# Patient Record
Sex: Female | Born: 1965 | Race: White | Hispanic: No
Health system: Southern US, Community
[De-identification: ages and names within clinical notes are randomized; demographics above are authoritative.]

## PROBLEM LIST (undated history)

## (undated) DIAGNOSIS — F329 Major depressive disorder, single episode, unspecified: Secondary | ICD-10-CM

## (undated) DIAGNOSIS — F32A Depression, unspecified: Secondary | ICD-10-CM

## (undated) DIAGNOSIS — F419 Anxiety disorder, unspecified: Secondary | ICD-10-CM

## (undated) HISTORY — DX: Depression, unspecified: F32.A

## (undated) HISTORY — DX: Major depressive disorder, single episode, unspecified: F32.9

## (undated) HISTORY — DX: Anxiety disorder, unspecified: F41.9

## (undated) HISTORY — PX: MOUTH SURGERY: SHX715

---

## 1998-02-01 ENCOUNTER — Other Ambulatory Visit: Admission: RE | Admit: 1998-02-01 | Discharge: 1998-02-01 | Payer: Self-pay | Admitting: *Deleted

## 1998-12-28 ENCOUNTER — Other Ambulatory Visit: Admission: RE | Admit: 1998-12-28 | Discharge: 1998-12-28 | Payer: Self-pay | Admitting: *Deleted

## 2000-02-07 ENCOUNTER — Other Ambulatory Visit: Admission: RE | Admit: 2000-02-07 | Discharge: 2000-02-07 | Payer: Self-pay | Admitting: *Deleted

## 2001-03-05 ENCOUNTER — Other Ambulatory Visit: Admission: RE | Admit: 2001-03-05 | Discharge: 2001-03-05 | Payer: Self-pay | Admitting: *Deleted

## 2001-03-11 ENCOUNTER — Encounter: Payer: Self-pay | Admitting: Emergency Medicine

## 2001-03-11 ENCOUNTER — Emergency Department (HOSPITAL_COMMUNITY): Admission: EM | Admit: 2001-03-11 | Discharge: 2001-03-11 | Payer: Self-pay | Admitting: Emergency Medicine

## 2002-03-25 ENCOUNTER — Other Ambulatory Visit: Admission: RE | Admit: 2002-03-25 | Discharge: 2002-03-25 | Payer: Self-pay | Admitting: *Deleted

## 2002-09-26 ENCOUNTER — Other Ambulatory Visit: Admission: RE | Admit: 2002-09-26 | Discharge: 2002-09-26 | Payer: Self-pay | Admitting: Gynecology

## 2003-04-14 ENCOUNTER — Inpatient Hospital Stay (HOSPITAL_COMMUNITY): Admission: AD | Admit: 2003-04-14 | Discharge: 2003-04-17 | Payer: Self-pay | Admitting: Gynecology

## 2003-05-31 ENCOUNTER — Other Ambulatory Visit: Admission: RE | Admit: 2003-05-31 | Discharge: 2003-05-31 | Payer: Self-pay | Admitting: Gynecology

## 2004-07-23 ENCOUNTER — Other Ambulatory Visit: Admission: RE | Admit: 2004-07-23 | Discharge: 2004-07-23 | Payer: Self-pay | Admitting: Gynecology

## 2005-02-11 ENCOUNTER — Ambulatory Visit: Payer: Self-pay | Admitting: General Practice

## 2005-08-07 ENCOUNTER — Other Ambulatory Visit: Admission: RE | Admit: 2005-08-07 | Discharge: 2005-08-07 | Payer: Self-pay | Admitting: Gynecology

## 2006-02-25 ENCOUNTER — Ambulatory Visit: Payer: Self-pay | Admitting: General Practice

## 2006-08-10 ENCOUNTER — Other Ambulatory Visit: Admission: RE | Admit: 2006-08-10 | Discharge: 2006-08-10 | Payer: Self-pay | Admitting: Gynecology

## 2007-12-27 ENCOUNTER — Other Ambulatory Visit: Admission: RE | Admit: 2007-12-27 | Discharge: 2007-12-27 | Payer: Self-pay | Admitting: Gynecology

## 2008-03-08 ENCOUNTER — Ambulatory Visit: Payer: Self-pay | Admitting: General Practice

## 2009-01-04 ENCOUNTER — Ambulatory Visit: Payer: Self-pay | Admitting: Women's Health

## 2009-01-04 ENCOUNTER — Encounter: Payer: Self-pay | Admitting: Women's Health

## 2009-01-04 ENCOUNTER — Other Ambulatory Visit: Admission: RE | Admit: 2009-01-04 | Discharge: 2009-01-04 | Payer: Self-pay | Admitting: Gynecology

## 2009-01-23 ENCOUNTER — Encounter: Admission: RE | Admit: 2009-01-23 | Discharge: 2009-01-23 | Payer: Self-pay | Admitting: Neurosurgery

## 2009-03-13 ENCOUNTER — Ambulatory Visit: Payer: Self-pay | Admitting: General Practice

## 2010-01-07 ENCOUNTER — Other Ambulatory Visit: Admission: RE | Admit: 2010-01-07 | Discharge: 2010-01-07 | Payer: Self-pay | Admitting: Gynecology

## 2010-01-07 ENCOUNTER — Ambulatory Visit: Payer: Self-pay | Admitting: Women's Health

## 2010-02-26 ENCOUNTER — Ambulatory Visit: Payer: Self-pay | Admitting: General Practice

## 2010-03-05 ENCOUNTER — Ambulatory Visit: Payer: Self-pay | Admitting: General Practice

## 2010-06-03 ENCOUNTER — Encounter: Payer: Self-pay | Admitting: Neurosurgery

## 2010-09-27 NOTE — Discharge Summary (Signed)
Audrey Schneider, Audrey Schneider                        ACCOUNT NO.:  [id number]   MEDICAL RECORD NO.:  [id number]                   PATIENT TYPE:  INP   LOCATION:  9130                                 FACILITY:  [facility]   PHYSICIAN:  Timothy P. Fontaine, M.D.           DATE OF BIRTH:  09-Jul-1965   DATE OF ADMISSION:  04/14/2003  DATE OF DISCHARGE:  04/17/2003                                 DISCHARGE SUMMARY   DISCHARGE DIAGNOSES:  1. Intrauterine pregnancy at 40+ weeks delivered.  2. Advanced maternal age.  3. Arrest disorder of dilatation in labor.  4. Status post primary low transverse cesarean section by Dr. Sylvester Harder     on April 14, 2003.   HISTORY OF PRESENT ILLNESS:  This is a 45 year old female gravida 1, para 0  with an estimated date of confinement of April 10, 2003. Prenatal course  was complicated by advanced maternal age. The patient declined  amniocentesis.   HOSPITAL COURSE:  On April 14, 2003 the patient was admitted at 40+ weeks  spontaneous rupture of membranes. Pitocin augmentation was begun and  subsequently, diagnosis of arrest disorder of dilatation in labor was made  and therefore, the patient underwent a primary low transverse cesarean  section by Dr. Sylvester Harder on April 14, 2003. Underwent delivery of a  female with Apgar's of 9 and 9 and weight of 8 pounds and 11 ounces. There  were no complications. Postoperatively, the patient remained afebrile.  Voiding and in stable condition. She was discharged to home on April 17, 2003 and given _________  with postpartum booklet.   LABORATORY DATA:  The patient is A+, Rubella immune. On April 16, 2003  hemoglobin was 8.9.   DISPOSITION:  The patient is discharged to home.   FOLLOW UP:  To return in 4 to 6 weeks.  If any problems prior to that time,  will see me in the office.   DISCHARGE MEDICATIONS:  Tylox p.r.n. for pain.     Susa Loffler, P.A.                    Timothy P. Audie Box,  M.D.    TSG/MEDQ  D:  05/08/2003  T:  05/08/2003  Job:  [id number]

## 2010-09-27 NOTE — Op Note (Signed)
Audrey Schneider, BUCY NO.:  [id number]   MEDICAL RECORD NO.:  [id number]                   PATIENT TYPE:  INP   LOCATION:  9130                                 FACILITY:  [facility]   PHYSICIAN:  James A. Ashley Royalty, M.D.             DATE OF BIRTH:  1965/09/21   DATE OF PROCEDURE:  04/14/2003  DATE OF DISCHARGE:                                 OPERATIVE REPORT   PREOPERATIVE DIAGNOSES:  1. Intrauterine pregnancy at 40 weeks' gestation.  2. Arrest disorder of dilatation in labor.   POSTOPERATIVE DIAGNOSES:  1. Intrauterine pregnancy at 40 weeks' gestation.  2. Arrest disorder of dilatation in labor.   PROCEDURE:  Primary low transverse cesarean section.   SURGEON:  Rudy Jew. Ashley Royalty, M.D.   ANESTHESIA:  Epidural.   ESTIMATED BLOOD LOSS:  800 mL.   FINDINGS:  An 8 pound 11 ounce female, Apgars 9 at one minute and 9 at five  minutes, sent to the newborn nursery.   COMPLICATIONS:  None.   PACKS AND DRAINS:  Foley.   Sponge, needle, and instrument count reported as correct x2.   DESCRIPTION OF PROCEDURE:  The patient was taken to the operating room and  placed in the dorsal supine position with a functioning epidural in place.  She was prepped and draped in the usual manner for abdominal surgery.  A  Foley catheter had been previously placed.  After surgical levels of  epidural anesthetic were verified, a Pfannenstiel incision was made down to  the level of the fascia.  The fascia was nicked with a knife and incised  transversely with Mayo scissors.  The underlying rectus muscles were  separated from the fascia using sharp and blunt dissection.  The rectus  muscles were separated in the midline exposing the peritoneum, which was  elevated with hemostats and entered atraumatically with Metzenbaum scissors.  The incision was extended longitudinally.  The uterus was identified and a  bladder flap created by incising the anterior uterine serosa.  The  bladder  was held inferiorly with a bladder blade.  The uterus was then entered  through a low transverse incision using sharp and blunt dissection.  Fluid  was clear.  The infant was then delivered from a vertex presentation in an  atraumatic manner.  The infant was suctioned.  The cord was clamped, cut,  and the infant given immediately to the awaiting pediatrics team.  Arterial  cord pH was obtained from an isolated umbilical cord segment.  Next regular  umbilical cord blood was obtained.  The placenta and membranes were removed  in their entirety.  The uterus was exteriorized.  The uterus was then closed  in two running layers with #1 Vicryl.  The first was a running locking  layer.  The second was a running, intermittently locking and imbricating  layer.  One additional figure-of-eight suture was required to obtain  hemostasis.  Hemostasis was noted.  The uterus, tubes, and ovaries were inspected and found to be otherwise  completely normal.  They were returned to the abdominal cavity.  Copious  irrigation was accomplished.  Hemostasis was noted.  the rectus muscles were  reapproximated in the midline using 0 Vicryl in an interrupted fashion.  The  fascia was closed with 0 Vicryl in a running fashion.  The skin was closed  with 3-0 Monocryl in a subcuticular fashion.   The patient tolerated the procedure extremely well and was returned to the  recovery room in good condition.                                               James A. Ashley Royalty, M.D.    JAM/MEDQ  D:  04/14/2003  T:  04/15/2003  Job:  [id number]

## 2010-12-19 ENCOUNTER — Other Ambulatory Visit: Payer: Self-pay

## 2010-12-19 MED ORDER — NORETHIN ACE-ETH ESTRAD-FE 1-20 MG-MCG PO TABS: 1.0000 | ORAL_TABLET | Freq: Every day | ORAL | Status: DC

## 2010-12-19 NOTE — Telephone Encounter (Signed)
PT STATES MAIL ORDER PHARMACY ALREADY 2 DAYS LATE GETTING HER OCP'S TO HER. SHE REQUESTING SAMPLE OR CALL IN 1 PACK TO PAY OUT OF POCKET TIL MAIL ORDER ARRIVES. PER NANCY THE ONLY SAMPLES WE HAVE ARE THE LO-LO-ESTRIN 24 & THE HORMONE IS TOO LOW & PT COULD HAVE BLEEDING ON THEM. BEST TO CALL IN GENERIC TO LOCAL PHARMACY. PT STATES THE GENERIC SHE IS ON IS [facility]. I ADVISED PT HER [facility]IGHT NOT [facility] THAT EXACT GENERIC BUT THEY WOULD GIVE HER A SIMILAR ONE WITH THE SAME BASIC HORMONES. PT. OK WITH THIS SO I CALLED TO [facility] & HE STATES THE 1 THEY HAVE IN STOCK IS JUNEL 1/20-#28. TOLD HIM OK TO FILL WITH 1 ADDITIONAL REFILL.

## 2011-01-07 DIAGNOSIS — F419 Anxiety disorder, unspecified: Secondary | ICD-10-CM | POA: Insufficient documentation

## 2011-01-07 DIAGNOSIS — F329 Major depressive disorder, single episode, unspecified: Secondary | ICD-10-CM | POA: Insufficient documentation

## 2011-01-07 DIAGNOSIS — F32A Depression, unspecified: Secondary | ICD-10-CM | POA: Insufficient documentation

## 2011-01-10 ENCOUNTER — Other Ambulatory Visit: Payer: Self-pay | Admitting: Women's Health

## 2011-01-10 NOTE — Telephone Encounter (Signed)
REFILLED 1 TIME ONLY. AEX WITH NANCY 01-16-11.

## 2011-01-15 ENCOUNTER — Encounter: Payer: Self-pay | Admitting: Women's Health

## 2011-01-15 ENCOUNTER — Ambulatory Visit (INDEPENDENT_AMBULATORY_CARE_PROVIDER_SITE_OTHER): Payer: BC Managed Care – PPO | Admitting: Women's Health

## 2011-01-15 ENCOUNTER — Other Ambulatory Visit (HOSPITAL_COMMUNITY)
Admission: RE | Admit: 2011-01-15 | Discharge: 2011-01-15 | Disposition: A | Discharge status: Home / Self Care | Payer: BC Managed Care – PPO | Source: Ambulatory Visit | Attending: Women's Health | Admitting: Women's Health

## 2011-01-15 VITALS — BP 130/70 | Ht 65.0 in | Wt 211.0 lb

## 2011-01-15 DIAGNOSIS — Z01419 Encounter for gynecological examination (general) (routine) without abnormal findings: Secondary | ICD-10-CM

## 2011-01-15 NOTE — Progress Notes (Signed)
Chantalle Anderle [birth year] [id number]    History:    The patient presents for annual exam.  Works at [employer], daughter [name] is 7 and doing well.   Past medical history, past surgical history, family history and social history were all reviewed and documented in the EPIC chart.   ROS:  A  ROS was performed and pertinent positives and negatives are included in the history.  Exam:  Filed Vitals:   01/15/11 0917  BP: 130/70    General appearance:  Normal Head/Neck:  Normal, without cervical or supraclavicular adenopathy. Thyroid:  Symmetrical, normal in size, without palpable masses or nodularity. Respiratory  Effort:  Normal  Auscultation:  Clear without wheezing or rhonchi Cardiovascular  Auscultation:  Regular rate, without rubs, murmurs or gallops  Edema/varicosities:  Not grossly evident Abdominal  Soft,nontender, without masses, guarding or rebound.  Liver/spleen:  No organomegaly noted  Hernia:  None appreciated  Skin  Inspection:  Grossly normal  Palpation:  Grossly normal Neurologic/psychiatric  Orientation:  Normal with appropriate conversation.  Mood/affect:  Normal  Genitourinary    Breasts: Examined lying and sitting.     Right: Without masses, retractions, discharge or axillary adenopathy.     Left: Without masses, retractions, discharge or axillary adenopathy.   Inguinal/mons:  Normal without inguinal adenopathy  External genitalia:  Normal  BUS/Urethra/Skene's glands:  Normal  Bladder:  Normal  Vagina:  Normal  Cervix:  Normal  Uterus:  normal in size, shape and contour.  Midline and mobile  Adnexa/parametria:     Rt: Without masses or tenderness.   Lt: Without masses or tenderness.  Anus and perineum: Normal  Digital rectal exam: Normal sphincter tone without palpated masses or tenderness  Assessment/Plan:  45 y.o.SWF G1P1  for annual exam. Monthly 2-3 day cycle on Loestrin 1/20 without complaint. She is currently on Celexa 20 mg daily and  states is doing much better with that and would like to continue. She has had counseling in the past denies need for any at this time. She does use an occasional Valium 5 mg for anxiety, is aware it is addictive and to use sparingly.  Normal GYN exam  Plan: SBEs, yearly mammogram which have been normal, encouraged a calcium rich diet, discouraged tanning. She has had her labs through work which have all been normal. Pap only today encouraged to continue exercise decrease calories for weight loss. Prescription, proper use, slight risk for blood clots and strokes were reviewed for the Loestrin, a prescription was given. Condoms encouraged if she becomes sexually active. Prescriptions for Celexa 20  1 by mouth daily, and Valium 5 mg when necessary were given.   Harrington Challenger [facility], 1:26 PM 01/15/2011

## 2011-02-08 ENCOUNTER — Other Ambulatory Visit: Payer: Self-pay | Admitting: Women's Health

## 2011-03-04 ENCOUNTER — Ambulatory Visit: Payer: Self-pay

## 2011-04-01 ENCOUNTER — Other Ambulatory Visit: Payer: Self-pay

## 2011-04-01 MED ORDER — DIAZEPAM 5 MG PO TABS: 5.0000 mg | ORAL_TABLET | Freq: Four times a day (QID) | ORAL | Status: AC | PRN

## 2011-04-01 NOTE — Telephone Encounter (Signed)
RX CALLED TO THE PHARMACY

## 2011-04-10 ENCOUNTER — Other Ambulatory Visit: Payer: Self-pay | Admitting: Women's Health

## 2011-07-21 IMAGING — MG MM DIGITAL SCREENING BILAT W/ CAD
1 series · 5 of 5 positions shown · non-contrast
Comparison: none

REASON FOR EXAM: [facility]
COMMENTS:

PROCEDURE:     MAM - MAM [facility]P SCRN MAMMO DIGITAL /CAD  - March 05, 2010  [DATE]
RESULT:      No dominant masses or pathologic clustered calcifications are
demonstrated.  A nodular parenchymal pattern is present. CAD evaluation is
nonfocal.

[Series 5742: R CC · right · 5 of 5 slices shown]
[im 1/5]
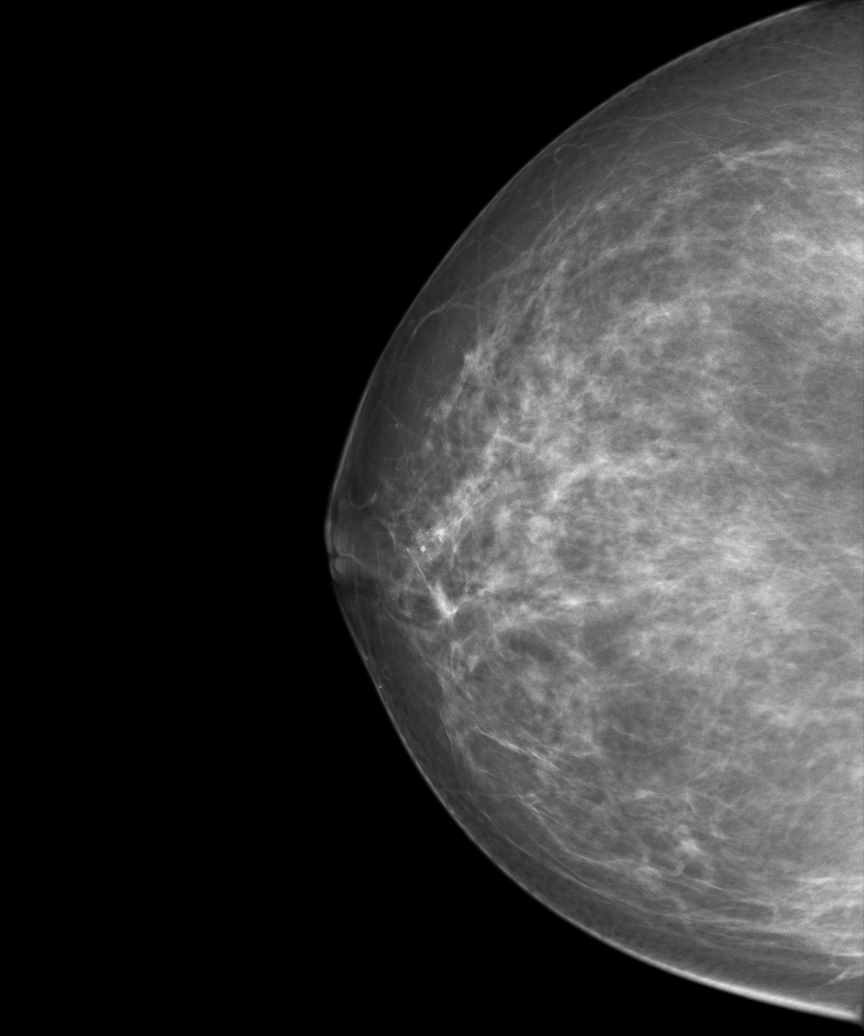
[im 2/5]
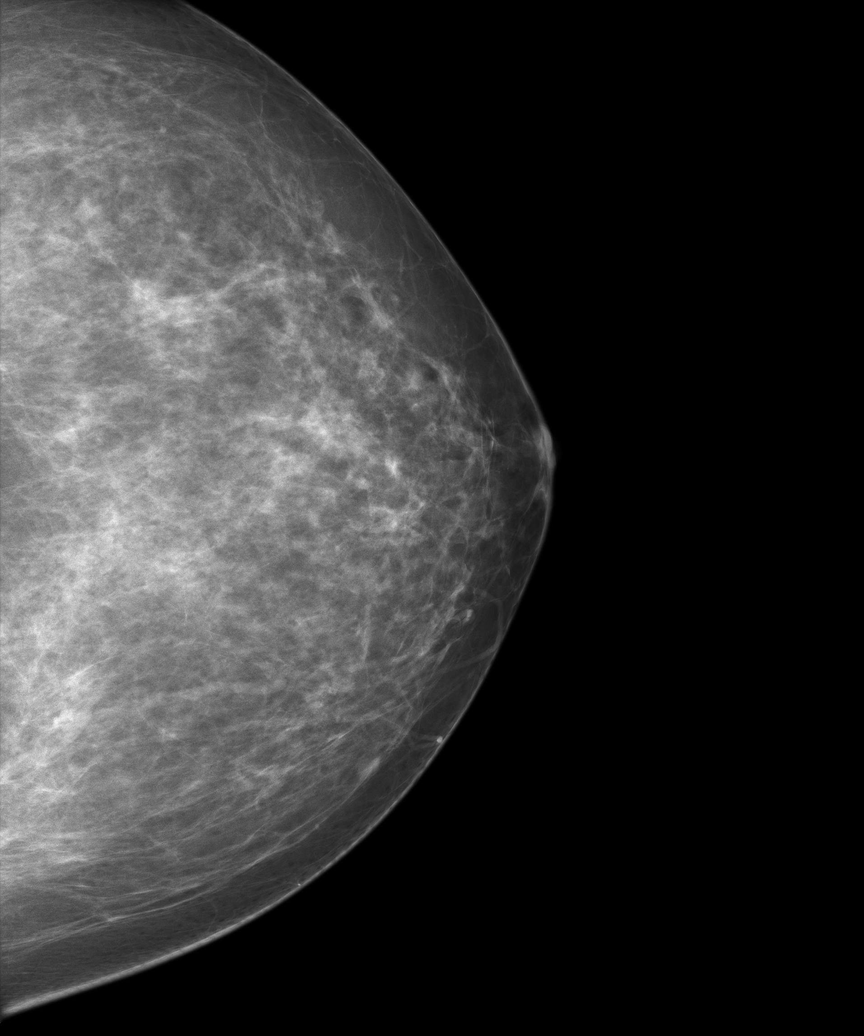
[im 3/5]
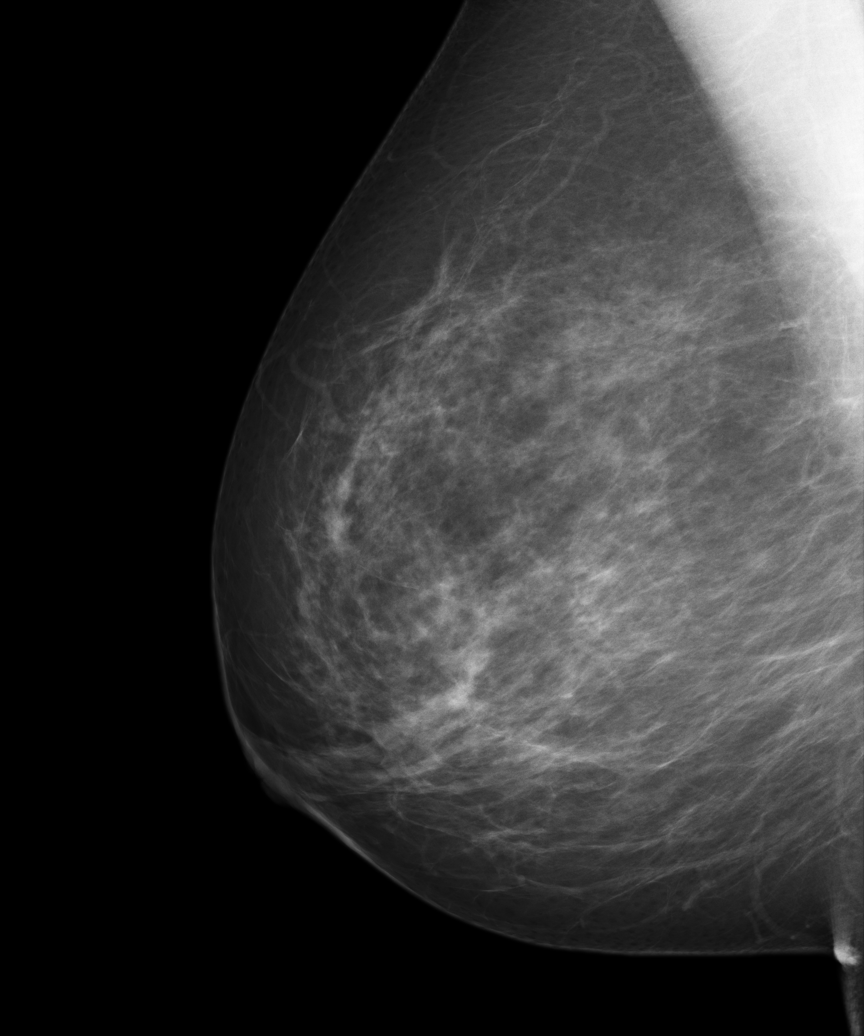
[im 4/5]
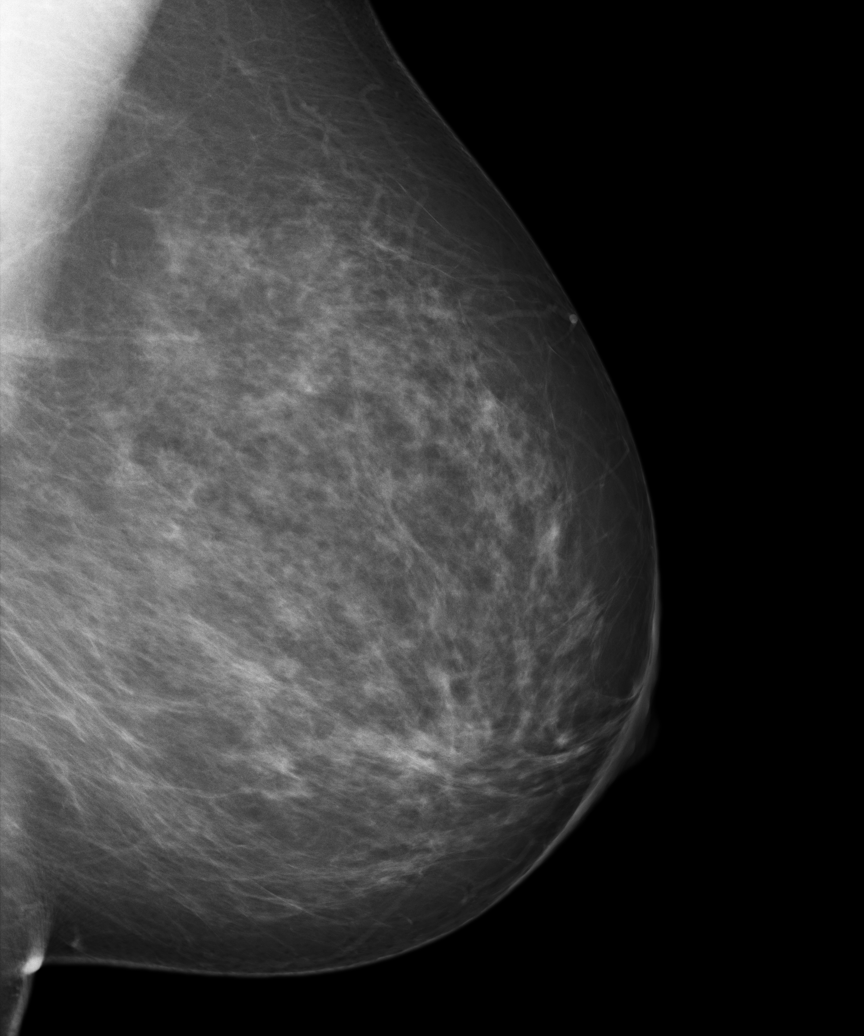
[im 5/5]
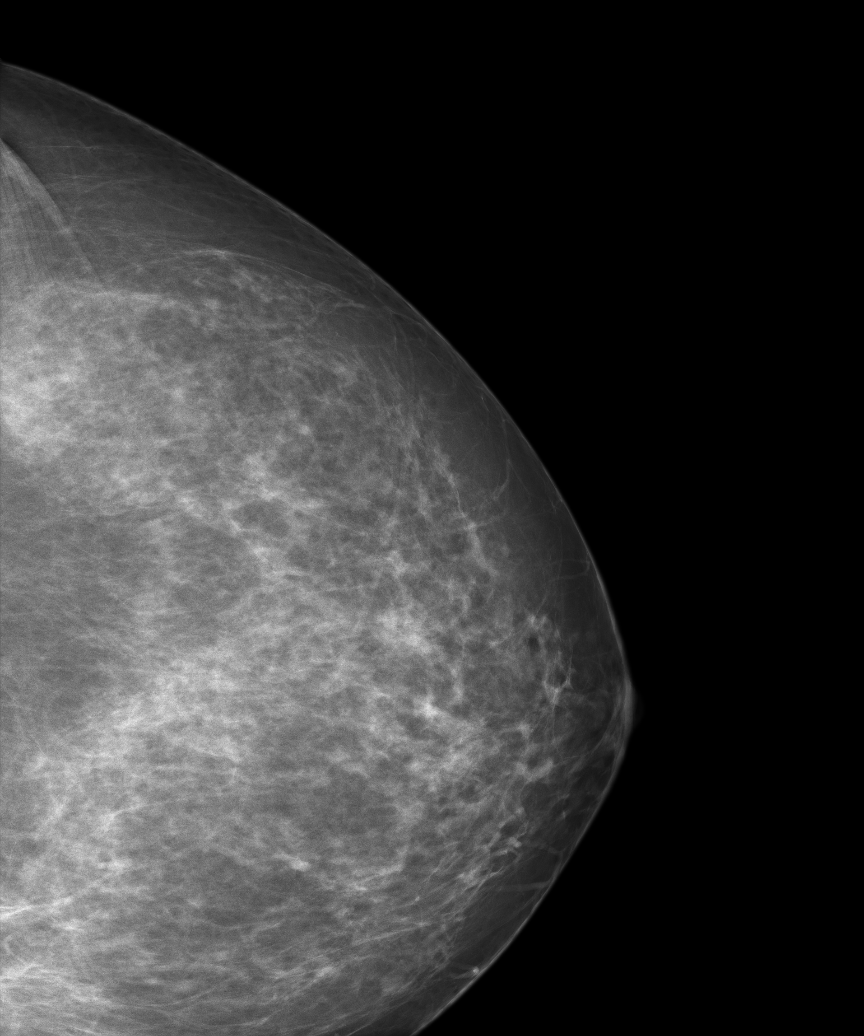

[5 of 5 positions shown; findings below may reference images not displayed]

IMPRESSION: 1.     Benign exam.
2.     Routine yearly follow up exam is suggested.
3.     BI-RADS:  Category 2- Benign Finding.

A negative mammogram report does not preclude biopsy or other evaluation of
a clinically palpable or otherwise suspicious mass or lesion. Breast cancer
may not be detected by mammography in up to 10% of cases.

## 2011-09-19 ENCOUNTER — Telehealth: Payer: Self-pay | Admitting: *Deleted

## 2011-09-19 MED ORDER — NORETHIN ACE-ETH ESTRAD-FE 1-20 MG-MCG PO TABS: 1.0000 | ORAL_TABLET | Freq: Every day | ORAL | Status: DC

## 2011-09-19 NOTE — Telephone Encounter (Signed)
Pt called requesting 1 pack called into local pharmacy, mail order messed rx up, rx sent.

## 2012-01-23 ENCOUNTER — Telehealth: Payer: Self-pay | Admitting: *Deleted

## 2012-01-23 MED ORDER — NORETHIN ACE-ETH ESTRAD-FE 1-20 MG-MCG PO TABS: 1.0000 | ORAL_TABLET | Freq: Every day | ORAL | Status: DC

## 2012-01-23 NOTE — Telephone Encounter (Signed)
Pt has annual scheduled on 01/28/12, she will need 1 pack sent to pharmacy. 1 pack of birth control sent.

## 2012-01-28 ENCOUNTER — Encounter: Payer: Self-pay | Admitting: Women's Health

## 2012-01-28 ENCOUNTER — Ambulatory Visit (INDEPENDENT_AMBULATORY_CARE_PROVIDER_SITE_OTHER): Payer: BC Managed Care – PPO | Admitting: Women's Health

## 2012-01-28 VITALS — BP 138/90 | Ht 65.0 in | Wt 213.0 lb

## 2012-01-28 DIAGNOSIS — Z01419 Encounter for gynecological examination (general) (routine) without abnormal findings: Secondary | ICD-10-CM

## 2012-01-28 DIAGNOSIS — F329 Major depressive disorder, single episode, unspecified: Secondary | ICD-10-CM

## 2012-01-28 DIAGNOSIS — F341 Dysthymic disorder: Secondary | ICD-10-CM

## 2012-01-28 DIAGNOSIS — F32A Depression, unspecified: Secondary | ICD-10-CM

## 2012-01-28 DIAGNOSIS — F419 Anxiety disorder, unspecified: Secondary | ICD-10-CM

## 2012-01-28 DIAGNOSIS — IMO0001 Reserved for inherently not codable concepts without codable children: Secondary | ICD-10-CM

## 2012-01-28 DIAGNOSIS — Z309 Encounter for contraceptive management, unspecified: Secondary | ICD-10-CM

## 2012-01-28 MED ORDER — CITALOPRAM HYDROBROMIDE 40 MG PO TABS: ORAL_TABLET | ORAL | Status: DC

## 2012-01-28 MED ORDER — NORETHINDRONE 0.35 MG PO TABS: 1.0000 | ORAL_TABLET | Freq: Every day | ORAL | Status: DC

## 2012-01-28 NOTE — Patient Instructions (Signed)

## 2012-01-28 NOTE — Progress Notes (Signed)
Audrey Schneider 1966-03-10 [id number]    History:    The patient presents for annual exam.  Amenorrheic on Loestrin 1/20 for history of menorrhagia with dysmenorrhea. Rare intercourse with same partner. History of anxiety and depression currently on Celexa 20 mg requesting to increase dosage. Seeing a therapist. History of normal Paps and mammograms. Blood pressure elevated at 138/90 today no history of blood pressure elevations.  Past medical history, past surgical history, family history and social history were all reviewed and documented in the EPIC chart. Works at [employer] in [employer]. Daughter [name] 8 having some problems with ADD.   ROS:  A  ROS was performed and pertinent positives and negatives are included in the history.  Exam:  Filed Vitals:   01/28/12 0835  BP: 138/90    General appearance:  Normal Head/Neck:  Normal, without cervical or supraclavicular adenopathy. Thyroid:  Symmetrical, normal in size, without palpable masses or nodularity. Respiratory  Effort:  Normal  Auscultation:  Clear without wheezing or rhonchi Cardiovascular  Auscultation:  Regular rate, without rubs, murmurs or gallops  Edema/varicosities:  Not grossly evident Abdominal  Soft,nontender, without masses, guarding or rebound.  Liver/spleen:  No organomegaly noted  Hernia:  None appreciated  Skin  Inspection:  Grossly normal  Palpation:  Grossly normal Neurologic/psychiatric  Orientation:  Normal with appropriate conversation.  Mood/affect:  Normal  Genitourinary    Breasts: Examined lying and sitting.     Right: Without masses, retractions, discharge or axillary adenopathy.     Left: Without masses, retractions, discharge or axillary adenopathy.   Inguinal/mons:  Normal without inguinal adenopathy  External genitalia:  Normal  BUS/Urethra/Skene's glands:  Normal  Bladder:  Normal  Vagina:  Normal  Cervix:  Normal  Uterus:   normal in size, shape and contour.  Midline and  mobile  Adnexa/parametria:     Rt: Without masses or tenderness.   Lt: Without masses or tenderness.  Anus and perineum: Normal  Digital rectal exam: Normal sphincter tone without palpated masses or tenderness  Assessment/Plan:  46 y.o. S. WF G1 P1 for annual exam.  Anxiety/depression on Celexa Amenorrheic on Loestrin 1/20 continuously  Plan: Celexa 40 mg daily prescription, proper use, continue counseling instructed to call if no relief of symptoms. Reviewed importance of exercise in relationship to anxiety/depression and general health. SBE's, continue annual mammogram, calcium rich diet, vitamin D 1000 daily, decrease calories for weight loss encouraged. Blood pressure slightly elevated, amenorrheic on Loestrin 1/20 will try Micronor daily, prescription, proper use, given and reviewed. Instructed to check blood pressure, if continues greater than 130/80 seek treatment with primary care. Reviewed increased risks of blood clots and strokes with combination birth control pills and elevated blood pressure. No labs, reports normal labs at work instructed to fax results to office. No Pap history of normal Paps, new screening guidelines reviewed.    Harrington Challenger [facility], 12:58 PM 01/28/2012

## 2012-01-31 ENCOUNTER — Other Ambulatory Visit: Payer: Self-pay | Admitting: Women's Health

## 2012-03-03 ENCOUNTER — Ambulatory Visit: Payer: Self-pay | Admitting: General Practice

## 2012-03-11 ENCOUNTER — Other Ambulatory Visit: Payer: Self-pay | Admitting: Women's Health

## 2012-04-26 ENCOUNTER — Other Ambulatory Visit: Payer: Self-pay | Admitting: *Deleted

## 2012-04-26 MED ORDER — DIAZEPAM 5 MG PO TABS: 5.0000 mg | ORAL_TABLET | Freq: Four times a day (QID) | ORAL | Status: DC | PRN

## 2012-04-26 NOTE — Telephone Encounter (Signed)
Called RX to pharmacy [facility]

## 2012-04-27 NOTE — Telephone Encounter (Signed)
Called to [facility] in [facility] not to [facility]

## 2012-05-24 ENCOUNTER — Telehealth: Payer: Self-pay | Admitting: *Deleted

## 2012-05-24 NOTE — Telephone Encounter (Signed)
Pt left message in voicemail requesting to switch birth to old rx, c/o spotting with Micronor.

## 2012-05-25 NOTE — Telephone Encounter (Signed)
Pt called back and took her blood pressure today and it was 110/74.

## 2012-05-25 NOTE — Telephone Encounter (Signed)
(  pt aware you are out of the office) Pt called requesting if she could switch back to old rx Loestrin 1/20, she is having some spotting within the pill back. Taking on time daily. I asked her about her blood pressure and she told me she has not been taking it. I told her I would relay message to you. Please advise

## 2012-05-26 ENCOUNTER — Other Ambulatory Visit: Payer: Self-pay | Admitting: Women's Health

## 2012-05-26 DIAGNOSIS — N946 Dysmenorrhea, unspecified: Secondary | ICD-10-CM

## 2012-05-26 MED ORDER — NORETHIN ACE-ETH ESTRAD-FE 1-20 MG-MCG PO TABS: 1.0000 | ORAL_TABLET | Freq: Every day | ORAL | Status: DC

## 2012-05-26 NOTE — Telephone Encounter (Signed)
Message left

## 2012-05-26 NOTE — Telephone Encounter (Signed)
Report states felt better on Loestrin 1/20 is having occasional spotting on the Micronor but states has increased cramps monthly. Blood pressure 110/70 will have nurse at work check blood pressure monthly and if greater than 130/80 will stop Loestrin and start back on Micronor. Rare intercourse/same partner.

## 2012-06-10 ENCOUNTER — Telehealth: Payer: Self-pay | Admitting: *Deleted

## 2012-06-10 DIAGNOSIS — N946 Dysmenorrhea, unspecified: Secondary | ICD-10-CM

## 2012-06-10 MED ORDER — NORETHIN ACE-ETH ESTRAD-FE 1-20 MG-MCG PO TABS: 1.0000 | ORAL_TABLET | Freq: Every day | ORAL | Status: DC

## 2012-06-10 NOTE — Telephone Encounter (Signed)
Pt called [facility] [place]. Rx sent.

## 2013-02-12 ENCOUNTER — Other Ambulatory Visit: Payer: Self-pay | Admitting: Women's Health

## 2013-02-17 ENCOUNTER — Ambulatory Visit (INDEPENDENT_AMBULATORY_CARE_PROVIDER_SITE_OTHER): Payer: PRIVATE HEALTH INSURANCE | Admitting: Women's Health

## 2013-02-17 ENCOUNTER — Encounter: Payer: Self-pay | Admitting: Women's Health

## 2013-02-17 ENCOUNTER — Other Ambulatory Visit (HOSPITAL_COMMUNITY)
Admission: RE | Admit: 2013-02-17 | Discharge: 2013-02-17 | Disposition: A | Discharge status: Home / Self Care | Payer: BC Managed Care – PPO | Source: Ambulatory Visit | Attending: Gynecology | Admitting: Gynecology

## 2013-02-17 VITALS — BP 126/84 | Ht 65.0 in | Wt 204.6 lb

## 2013-02-17 DIAGNOSIS — G8929 Other chronic pain: Secondary | ICD-10-CM | POA: Insufficient documentation

## 2013-02-17 DIAGNOSIS — M549 Dorsalgia, unspecified: Secondary | ICD-10-CM

## 2013-02-17 DIAGNOSIS — F411 Generalized anxiety disorder: Secondary | ICD-10-CM

## 2013-02-17 DIAGNOSIS — Z01419 Encounter for gynecological examination (general) (routine) without abnormal findings: Secondary | ICD-10-CM

## 2013-02-17 DIAGNOSIS — N946 Dysmenorrhea, unspecified: Secondary | ICD-10-CM

## 2013-02-17 DIAGNOSIS — G894 Chronic pain syndrome: Secondary | ICD-10-CM

## 2013-02-17 MED ORDER — DIAZEPAM 5 MG PO TABS: 5.0000 mg | ORAL_TABLET | Freq: Four times a day (QID) | ORAL | Status: DC | PRN

## 2013-02-17 MED ORDER — CITALOPRAM HYDROBROMIDE 40 MG PO TABS: ORAL_TABLET | ORAL | Status: DC

## 2013-02-17 MED ORDER — HYDROCODONE-ACETAMINOPHEN 5-325 MG PO TABS: 1.0000 | ORAL_TABLET | Freq: Four times a day (QID) | ORAL | Status: DC | PRN

## 2013-02-17 MED ORDER — NORETHIN ACE-ETH ESTRAD-FE 1-20 MG-MCG PO TABS: 1.0000 | ORAL_TABLET | Freq: Every day | ORAL | Status: DC

## 2013-02-17 NOTE — Patient Instructions (Signed)

## 2013-02-17 NOTE — Addendum Note (Signed)
Addended by: Richardson Chiquito on: 02/17/2013 04:24 PM   Modules accepted: Orders

## 2013-02-17 NOTE — Progress Notes (Signed)
Audrey Schneider [birth year] [id number]    History:    The patient presents for annual exam.  Complains of low back pain since Saturday morning, preventing full ROM/ mobility.  Had a deep tissue massage with some relief.  Chronic back pain managed by orthopedist, last Rx several months ago, has not picked up a new Rx due to changes in law  Has appointment to go to pain clinic in December.  Denies urinary symptoms, constipation, or lower abdominal/ pelvic pain.  Sexually active/ same partner/ takes Loestrin 1/20 continuously.  Anxiety/ depression managed with Celexa, rare Valium and counseling .  No sleeping issues.  10 lb weight loss within year from healthy dieting.  History of normal paps and mammograms.  Recent lab work normal.     Past medical history, past surgical history, family history and social history were all reviewed and documented in the EPIC chart. Works at [employer] x 17 years  Healthy 11 yo daughter Clement Sayres, ADD managed well  Mother - Diabetes, father died age 45  ROS:  A  ROS was performed and pertinent positives and negatives are included in the history.  Exam:  Filed Vitals:   02/17/13 0817  BP: 126/84    General appearance:  Normal Head/Neck:  Normal, without cervical or supraclavicular adenopathy. Thyroid:  Symmetrical, normal in size, without palpable masses or nodularity. Respiratory  Effort:  Normal  Auscultation:  Clear without wheezing or rhonchi Cardiovascular  Auscultation:  Regular rate, without rubs, murmurs or gallops  Edema/varicosities:  Not grossly evident Abdominal  Soft,nontender, without masses, guarding or rebound.  Liver/spleen:  No organomegaly noted  Hernia:  None appreciated  Skin  Inspection:  Grossly normal  Palpation:  Grossly normal Neurologic/psychiatric  Orientation:  Normal with appropriate conversation.  Mood/affect:  Normal  Genitourinary    Breasts: Examined lying and sitting.     Right: Without masses,  retractions, discharge or axillary adenopathy.     Left: Without masses, retractions, discharge or axillary adenopathy.   Inguinal/mons:  Normal without inguinal adenopathy  External genitalia:  Normal  BUS/Urethra/Skene's glands:  Normal  Bladder:  Normal  Vagina:  Normal  Cervix:  Normal  Uterus:  Normal in size, shape and contour.  Midline and mobile  Adnexa/parametria:     Rt: Without masses or tenderness.   Lt: Without masses or tenderness.  Anus and perineum: Normal  Digital rectal exam: Normal sphincter tone without palpated masses or tenderness  Assessment/Plan:  47 y.o.  SWF G1P1 for annual exam.    Normal Gynecological Exam Chronic Back Pain - managed with Vicodin from orthopedist Anxiety/Depression - Celexa Obesity - 10 lb weight loss within year  Plan: Loestrin 1/20 prescription, proper use, slight risk for blood clots and strokes. Takes continuously/amenorrheic. Vicodin 5/325 prescription #30 with no refills given, aware of addictive properties, uses sparingly, has appointment with pain clinic in December which will keep. The Celexa 40 mg by mouth daily prescription, proper use given and reviewed continue counseling as needed. Valium 5 mg po q 6 hrs prn, aware addictive properties uses one prescription every 6 months.    Continue SBEs, annual mammograms, weight loss, healthy diet/ exercise/ yoga, anxiety management.  Pap.  Pap normal 2012, new screening guidelines reviewed. Instructed to fax lab results and mammogram that are done through work.   Harrington Challenger [facility], 8:53 AM 02/17/2013

## 2013-03-10 ENCOUNTER — Ambulatory Visit: Payer: Self-pay | Admitting: General Practice

## 2013-03-22 ENCOUNTER — Telehealth: Payer: Self-pay | Admitting: Women's Health

## 2013-03-22 NOTE — Telephone Encounter (Signed)
To review labs and mammogram. Mammogram normal but noted to be dense encouraged and reviewed 3-D tomography with next mammogram. CBC, comprehensive metabolic, thyroid panel all normal. Lipid panel slightly elevated reviewed importance of decreasing saturated fat to less than 20 g per day, fish oil supplement daily and increase regular exercise. Reviewed hemoglobin A1c elevated at 6 which puts her at more risk for diabetes. Reviewed importance of reducing simple sugars in diet. Random blood sugar 99.

## 2013-07-19 IMAGING — MG MM DIGITAL SCREENING BILAT W/ CAD
1 series · 4 of 4 positions shown · non-contrast
Comparison: none

REASON FOR EXAM: [facility] no order Biedronki [place]ocha [id number]
COMMENTS:

PROCEDURE:     MAM - MAM DGT SCR W/CAD CORP NO ORDER  - March 03, 2012  [DATE]
RESULT:     Breast are moderately dense and nodular. CAD evaluation is
nonfocal. Exam stable from prior exams.

[R CC · right · 4 of 4 slices shown]
[im 1/4]
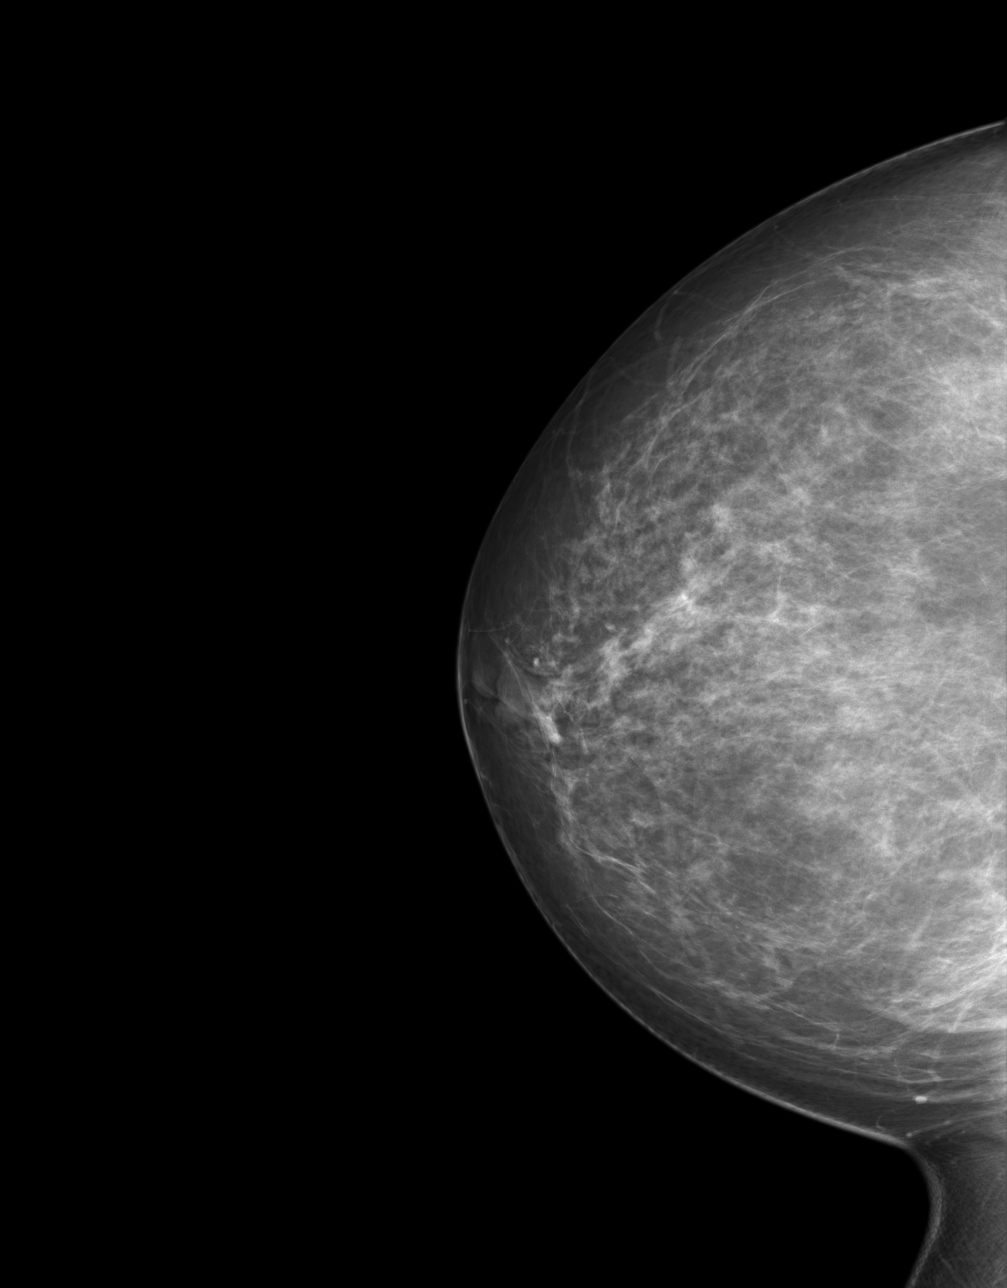
[im 2/4]
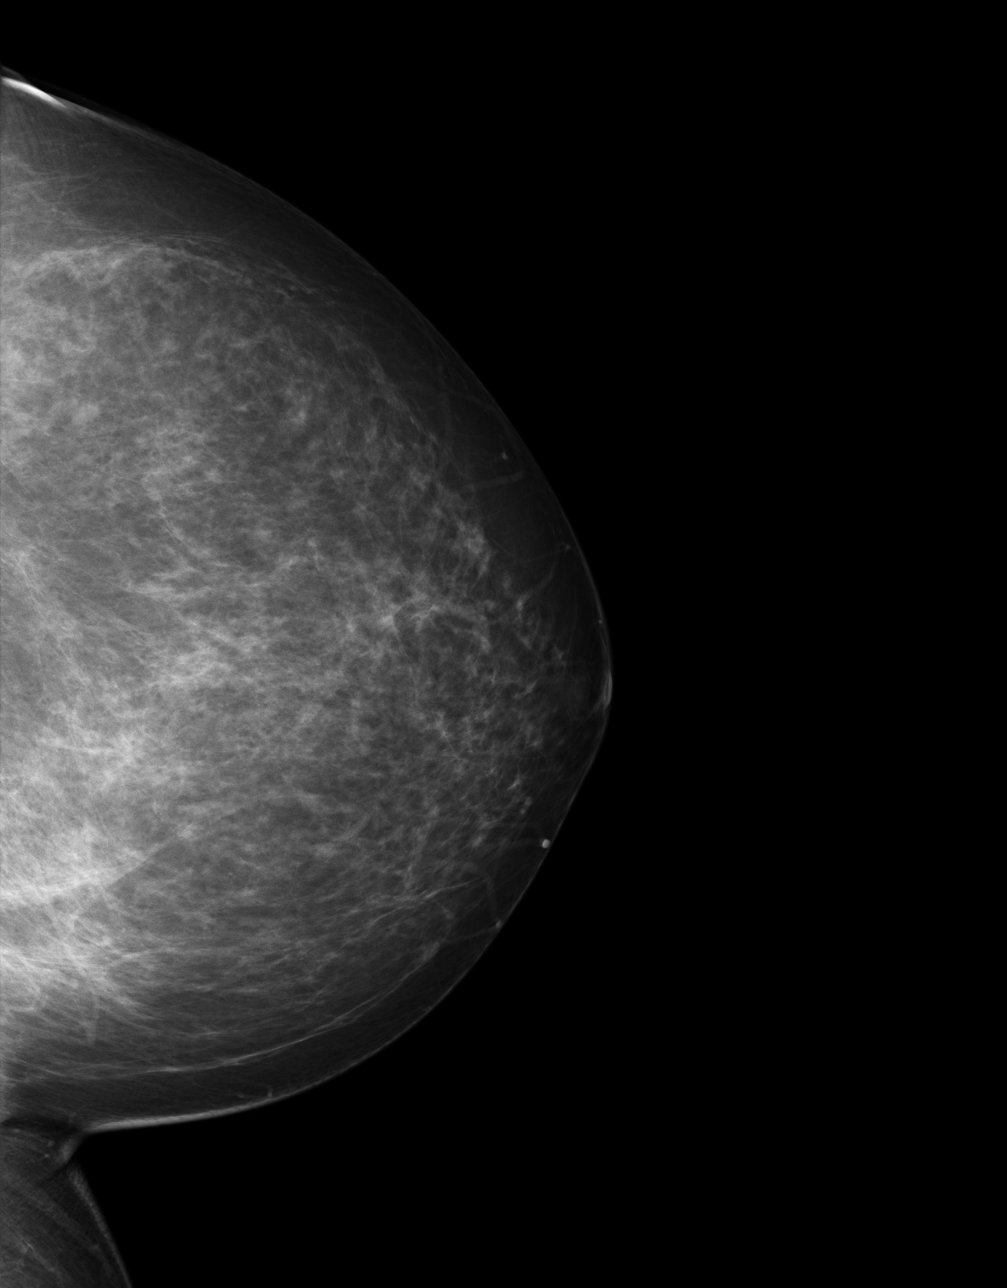
[im 3/4]
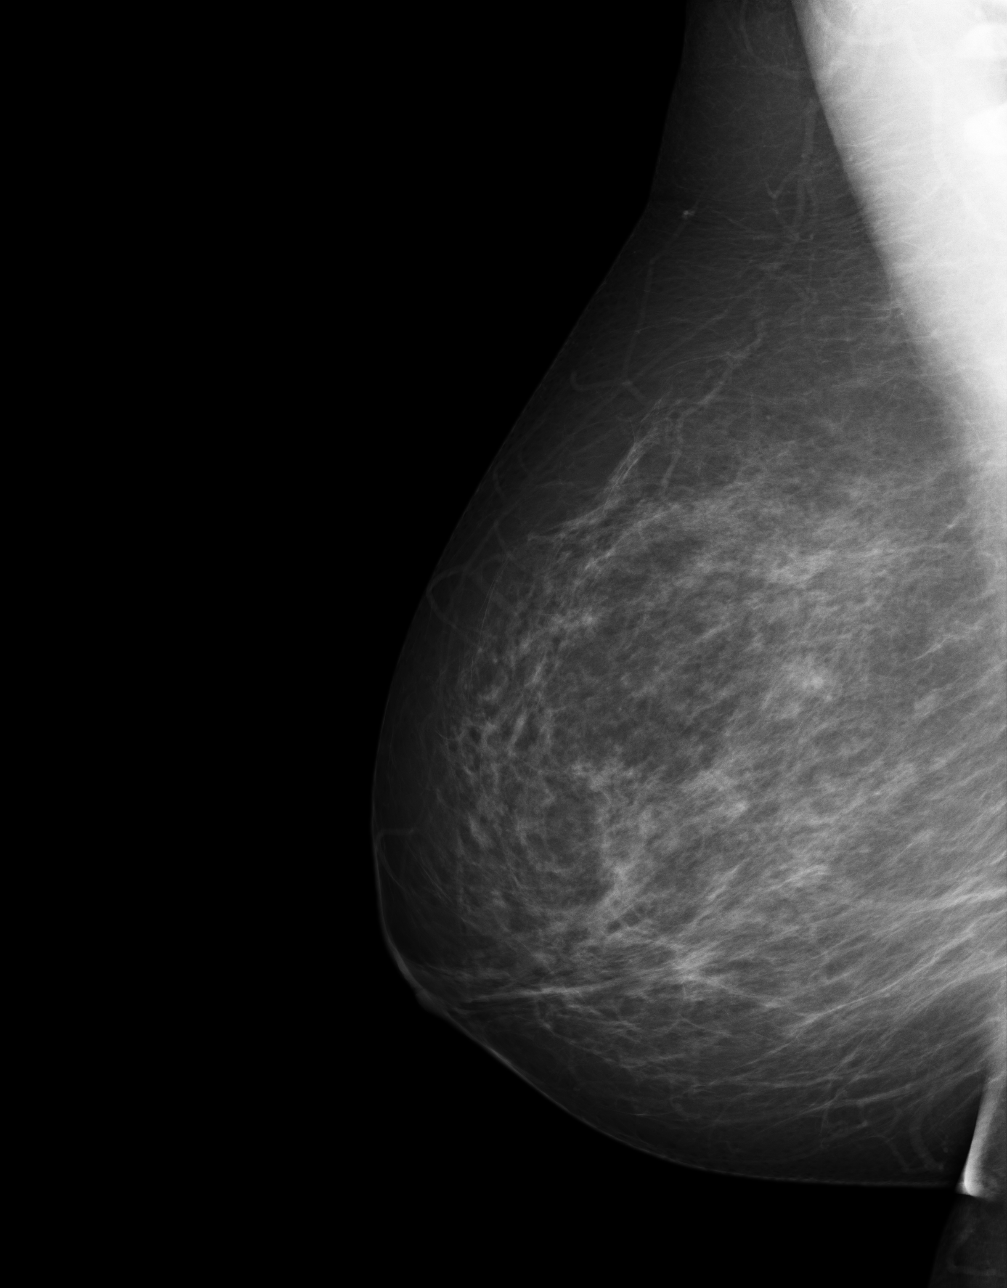
[im 4/4]
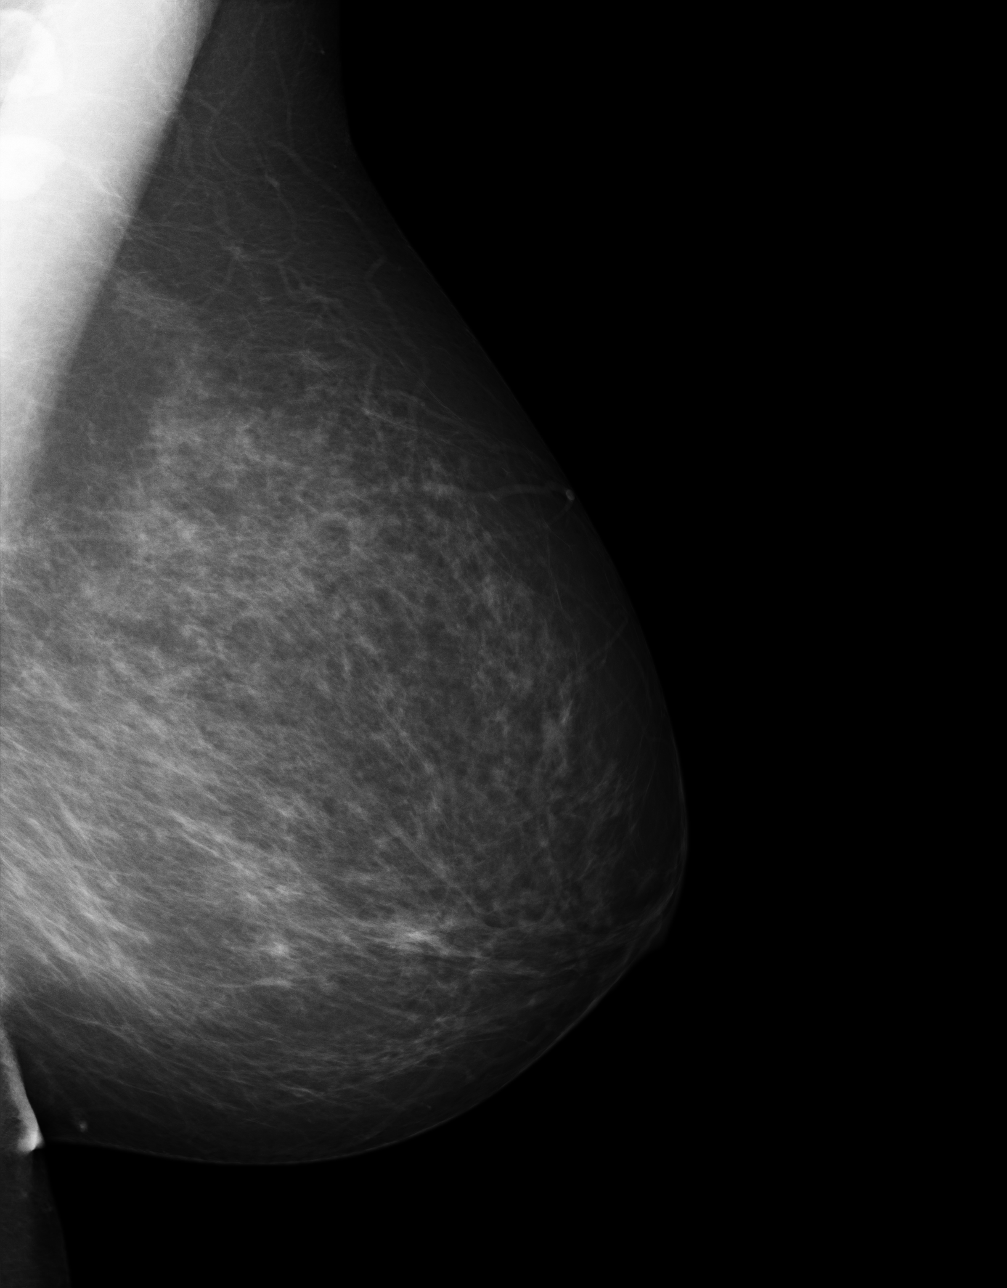

[4 of 4 positions shown; findings below may reference images not displayed]

IMPRESSION: Stable benign exam.

BI-RADS: Category 2- Benign Finding

A NEGATIVE MAMMOGRAM REPORT DOES NOT PRECLUDE BIOPSY OR OTHER EVALUATION OF
A CLINICALLY PALPABLE OR OTHERWISE SUSPICIOUS MASS OR LESION. BREAST CANCER
MAY NOT BE DETECTED IN UP TO 10% OF CASES.

## 2014-02-22 ENCOUNTER — Encounter: Payer: Self-pay | Admitting: Women's Health

## 2014-02-22 ENCOUNTER — Ambulatory Visit (INDEPENDENT_AMBULATORY_CARE_PROVIDER_SITE_OTHER): Payer: No Typology Code available for payment source | Admitting: Women's Health

## 2014-02-22 VITALS — BP 120/70 | Ht 65.0 in | Wt 223.0 lb

## 2014-02-22 DIAGNOSIS — F329 Major depressive disorder, single episode, unspecified: Secondary | ICD-10-CM

## 2014-02-22 DIAGNOSIS — Z01419 Encounter for gynecological examination (general) (routine) without abnormal findings: Secondary | ICD-10-CM

## 2014-02-22 DIAGNOSIS — F32A Depression, unspecified: Secondary | ICD-10-CM

## 2014-02-22 DIAGNOSIS — N946 Dysmenorrhea, unspecified: Secondary | ICD-10-CM

## 2014-02-22 LAB — URINALYSIS W MICROSCOPIC + REFLEX CULTURE
BILIRUBIN URINE: NEGATIVE
CASTS: NONE SEEN
Crystals: NONE SEEN
Glucose, UA: NEGATIVE mg/dL
KETONES UR: NEGATIVE mg/dL
Leukocytes, UA: NEGATIVE
Nitrite: NEGATIVE
PH: 7 (ref 5.0–8.0)
Protein, ur: NEGATIVE mg/dL
SPECIFIC GRAVITY, URINE: 1.021 (ref 1.005–1.030)
SQUAMOUS EPITHELIAL / LPF: NONE SEEN
Urobilinogen, UA: 0.2 mg/dL (ref 0.0–1.0)

## 2014-02-22 MED ORDER — NORETHIN ACE-ETH ESTRAD-FE 1-20 MG-MCG PO TABS: 1.0000 | ORAL_TABLET | Freq: Every day | ORAL | Status: DC

## 2014-02-22 MED ORDER — CITALOPRAM HYDROBROMIDE 40 MG PO TABS: ORAL_TABLET | ORAL | Status: DC

## 2014-02-22 NOTE — Progress Notes (Signed)
Audrey Schneider M Audrey Schneider 03-May-1966 [id number]    History:    Presents for annual exam.  Amenorrheic on Loestrin 1/20 continuously. Has had problems with chronic back pain is currently on no medication. Struggling with ADD symptoms, has taken daughters Concerta, which has helped and would like a prescription to try for one month. Labs at work. Normal Pap and mammogram history. Anxiety/depression stable on Celexa.  Past medical history, past surgical history, family history and social history were all reviewed and documented in the EPIC chart. Works at [employer] show [employer]. Audrey Schneider, 9 doing well. Mother diabetes died age 162, father died age 32100.  ROS:  A  12 point ROS was performed and pertinent positives and negatives are included.  Exam:  Filed Vitals:   02/22/14 0756  BP: 120/70    General appearance:  Normal Thyroid:  Symmetrical, normal in size, without palpable masses or nodularity. Respiratory  Auscultation:  Clear without wheezing or rhonchi Cardiovascular  Auscultation:  Regular rate, without rubs, murmurs or gallops  Edema/varicosities:  Not grossly evident Abdominal  Soft,nontender, without masses, guarding or rebound.  Liver/spleen:  No organomegaly noted  Hernia:  None appreciated  Skin  Inspection:  Grossly normal   Breasts: Examined lying and sitting.     Right: Without masses, retractions, discharge or axillary adenopathy.     Left: Without masses, retractions, discharge or axillary adenopathy. Gentitourinary   Inguinal/mons:  Normal without inguinal adenopathy  External genitalia:  Normal  BUS/Urethra/Skene's glands:  Normal  Vagina:  Normal  Cervix:  Normal  Uterus:  normal in size, shape and contour.  Midline and mobile  Adnexa/parametria:     Rt: Without masses or tenderness.   Lt: Without masses or tenderness.  Anus and perineum: Normal  Digital rectal exam: Normal sphincter tone without palpated masses or tenderness  Assessment/Plan:  48 y.o. SWF G1P1 for  annual exam.     Amenorrheic on Loestrin continuously Anxiety/depression stable on Celexa Obesity Probable ADD  Plan: Concerta 36 mg 2 tablets daily prescription, proper use given and reviewed need to seek care with psychologist for ADD testing and to continue prescribing if needed (one month supply only).  Celexa 40 mg by mouth daily prescription, proper use given and reviewed. Reviewed importance of leisure activities. Loestrin 1/20 prescription, proper use given and reviewed slight risk for blood clots and strokes. Instructed to schedule annual exam during placebo week [facility]. SBE's, continue annual mammogram, 3-D tomography reviewed and encouraged history of dense breast. Increase regular exercise and decrease calories, Weight Watchers encouraged for weight loss. Pap normal 2014, new screening guidelines reviewed. Instructed to fax lab results from work health screening.   Harrington ChallengerYOUNG,Audrey Schneider [facility]ownWHNP, 8:42 AM 02/22/2014

## 2014-02-22 NOTE — Patient Instructions (Signed)

## 2014-02-25 LAB — URINE CULTURE: Colony Count: >=100000

## 2014-02-28 ENCOUNTER — Other Ambulatory Visit: Payer: Self-pay | Admitting: Women's Health

## 2014-02-28 DIAGNOSIS — N3001 Acute cystitis with hematuria: Secondary | ICD-10-CM

## 2014-02-28 MED ORDER — SULFAMETHOXAZOLE-TMP DS 800-160 MG PO TABS: 1.0000 | ORAL_TABLET | Freq: Two times a day (BID) | ORAL | Status: DC

## 2014-03-09 ENCOUNTER — Other Ambulatory Visit: Payer: No Typology Code available for payment source

## 2014-03-09 DIAGNOSIS — N3001 Acute cystitis with hematuria: Secondary | ICD-10-CM

## 2014-03-10 LAB — URINALYSIS W MICROSCOPIC + REFLEX CULTURE
BILIRUBIN URINE: NEGATIVE
CASTS: NONE SEEN
Crystals: NONE SEEN
GLUCOSE, UA: NEGATIVE mg/dL
HGB URINE DIPSTICK: NEGATIVE
Ketones, ur: NEGATIVE mg/dL
LEUKOCYTES UA: NEGATIVE
Nitrite: NEGATIVE
PH: 6.5 (ref 5.0–8.0)
Protein, ur: NEGATIVE mg/dL
Specific Gravity, Urine: 1.022 (ref 1.005–1.030)
Urobilinogen, UA: 0.2 mg/dL (ref 0.0–1.0)

## 2014-03-13 ENCOUNTER — Encounter: Payer: Self-pay | Admitting: Women's Health

## 2014-03-13 LAB — URINE CULTURE: Colony Count: >=100000

## 2014-03-14 ENCOUNTER — Other Ambulatory Visit: Payer: Self-pay | Admitting: Women's Health

## 2014-03-14 DIAGNOSIS — R350 Frequency of micturition: Secondary | ICD-10-CM

## 2014-03-23 ENCOUNTER — Other Ambulatory Visit: Payer: Self-pay

## 2014-03-23 DIAGNOSIS — F329 Major depressive disorder, single episode, unspecified: Secondary | ICD-10-CM

## 2014-03-23 DIAGNOSIS — F32A Depression, unspecified: Secondary | ICD-10-CM

## 2014-03-23 MED ORDER — CITALOPRAM HYDROBROMIDE 40 MG PO TABS: ORAL_TABLET | ORAL | Status: DC

## 2014-03-29 ENCOUNTER — Telehealth: Payer: Self-pay | Admitting: *Deleted

## 2014-03-29 NOTE — Telephone Encounter (Signed)
Have her call Dr. Emerson MonteParrish McKinney.

## 2014-03-29 NOTE — Telephone Encounter (Signed)
Left the below on pt voicemail with number as well [phone]

## 2014-03-29 NOTE — Telephone Encounter (Signed)
Pt called requesting refill for Concerta 36 mg, pt said she is having trouble getting appointment with psychologist. She said you can call her at [phone]  If needed.

## 2014-04-18 ENCOUNTER — Telehealth: Payer: Self-pay

## 2014-04-18 NOTE — Telephone Encounter (Signed)
Left detailed message home number.

## 2014-04-18 NOTE — Telephone Encounter (Signed)
Best to have a psychologist/psychiatrist prescribed and monitor medications. They are medications that usually cannot be escribed but must be a written prescription.

## 2014-04-18 NOTE — Telephone Encounter (Signed)
Patient said you were referring her to psych for ADHD diagnosis and continued treatment.  She said she found this man Tina Griffithsim White, "who is a Patent examinerlife coach and does other things".  He can work her up for this and "do an adult screening with levels".  However, he cannot prescribe medication. She wondered if you would be able to prescribe the medication for her.  If not he will refer her to someone else for this.

## 2014-04-25 ENCOUNTER — Other Ambulatory Visit: Payer: Self-pay

## 2014-04-25 DIAGNOSIS — N946 Dysmenorrhea, unspecified: Secondary | ICD-10-CM

## 2014-04-25 MED ORDER — NORETHIN ACE-ETH ESTRAD-FE 1-20 MG-MCG PO TABS: 1.0000 | ORAL_TABLET | Freq: Every day | ORAL | Status: DC

## 2014-07-26 IMAGING — MG MM DIGITAL SCREENING BILAT W/ CAD
1 series · 4 of 4 positions shown · non-contrast
Comparison: Previous exam(s).

CLINICAL DATA: Screening.

EXAM:
DIGITAL SCREENING BILATERAL MAMMOGRAM WITH CAD

[R CC · right · 4 of 4 slices shown]
[im 1/4]
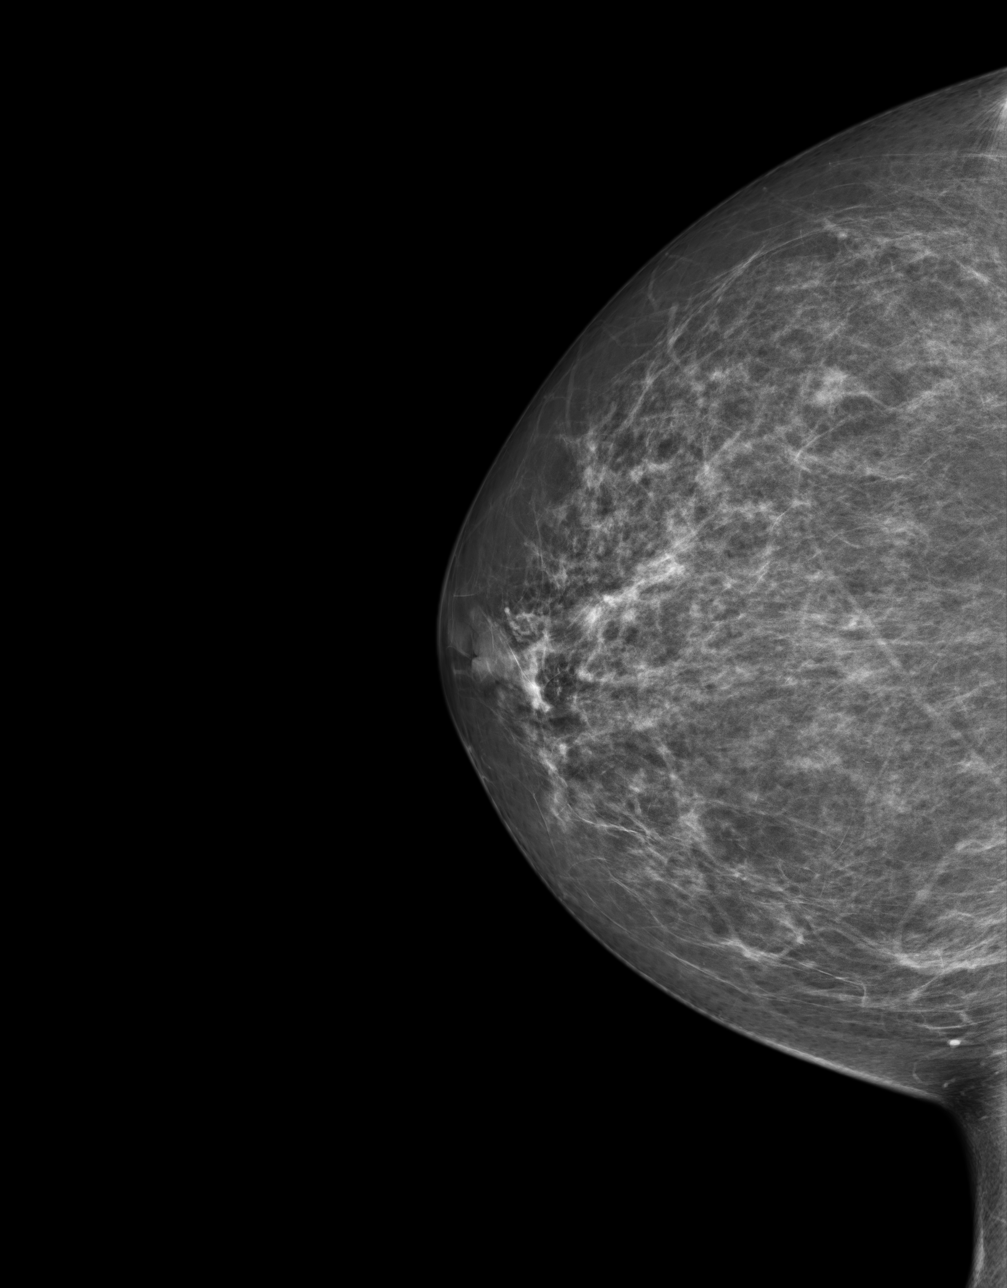
[im 2/4]
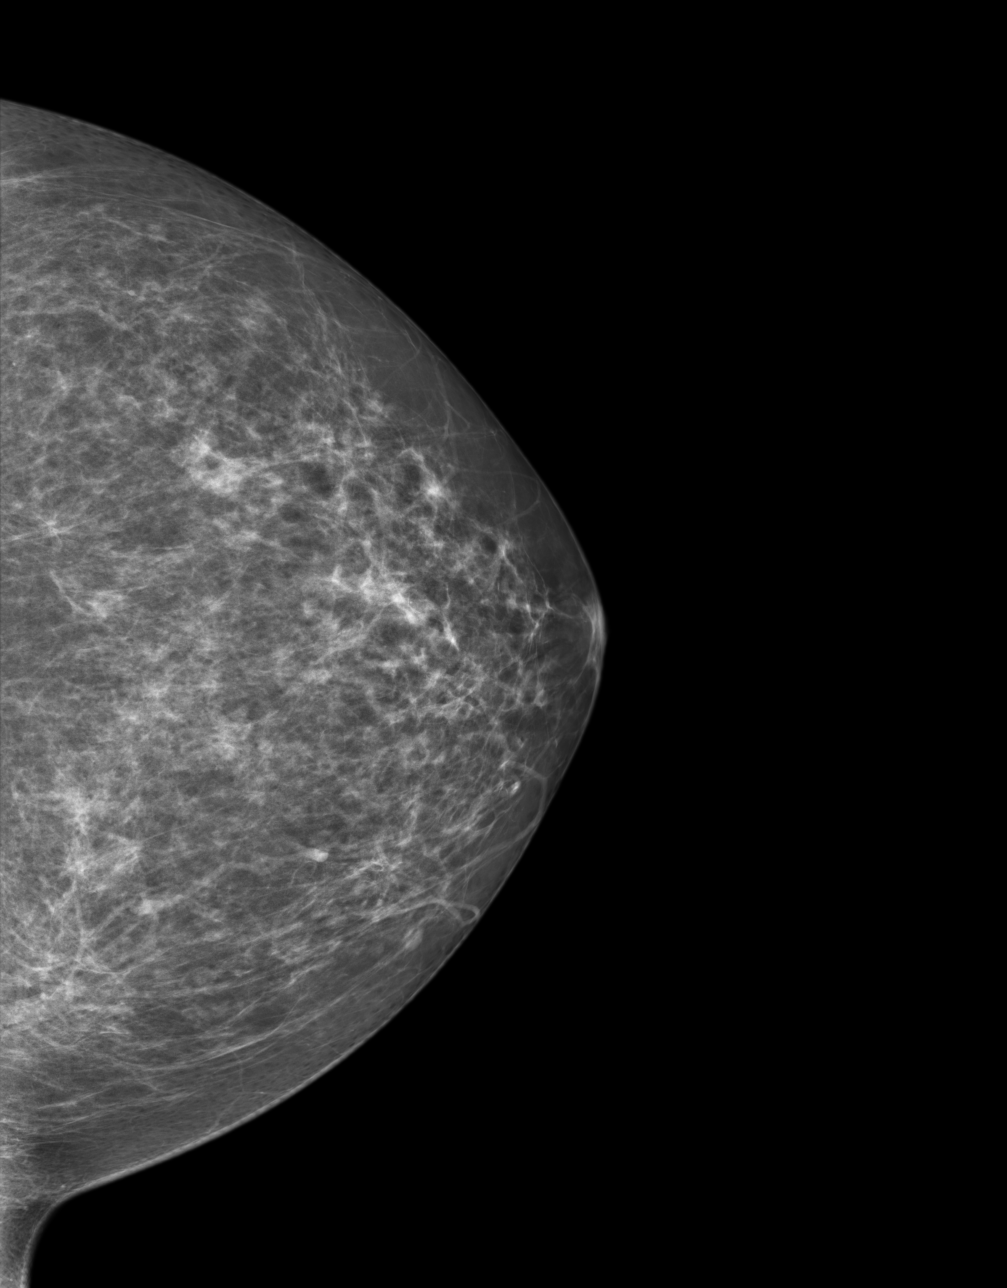
[im 3/4]
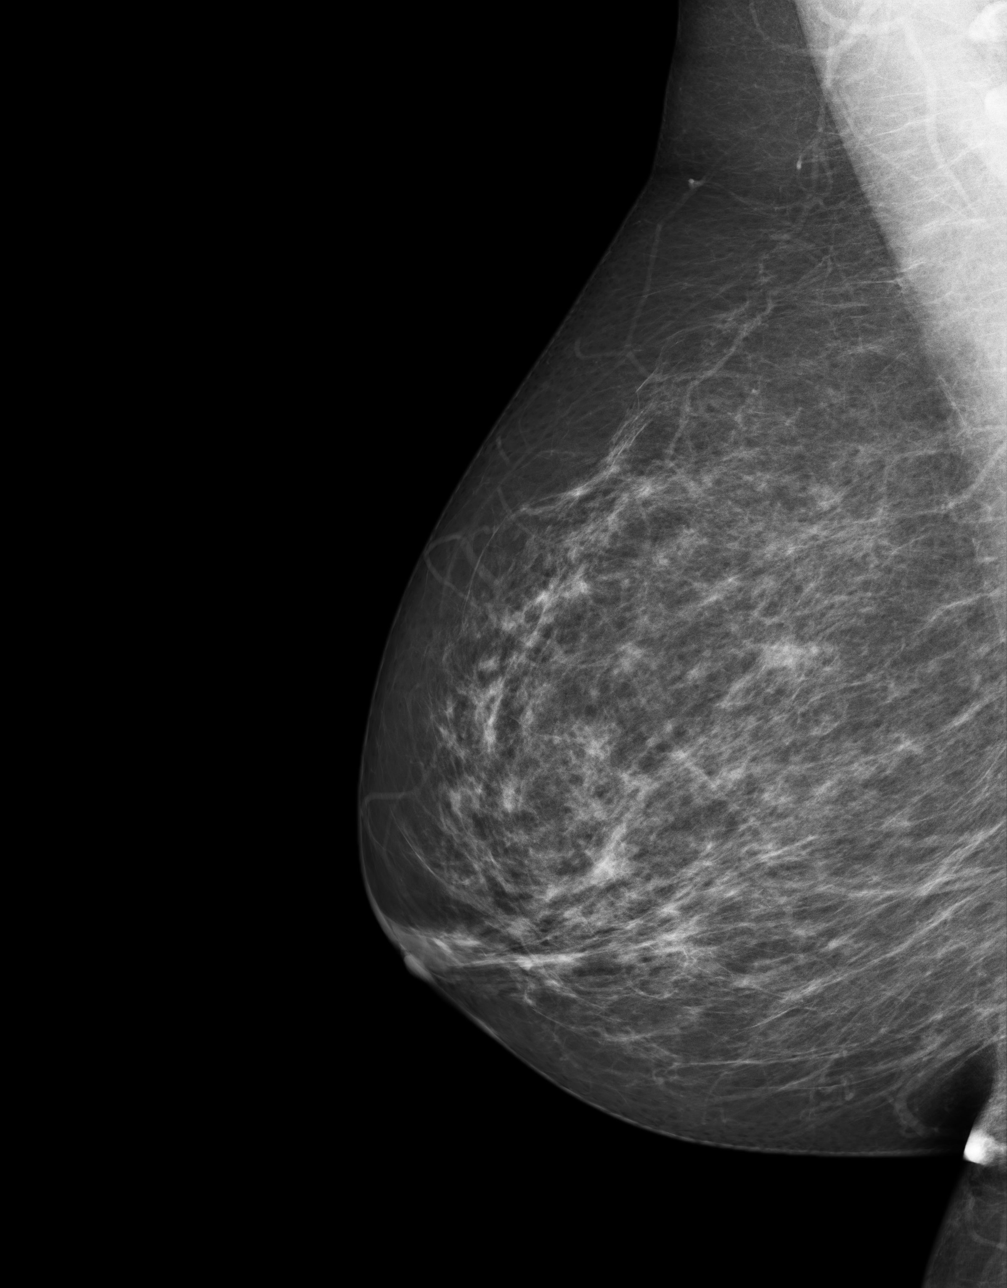
[im 4/4]
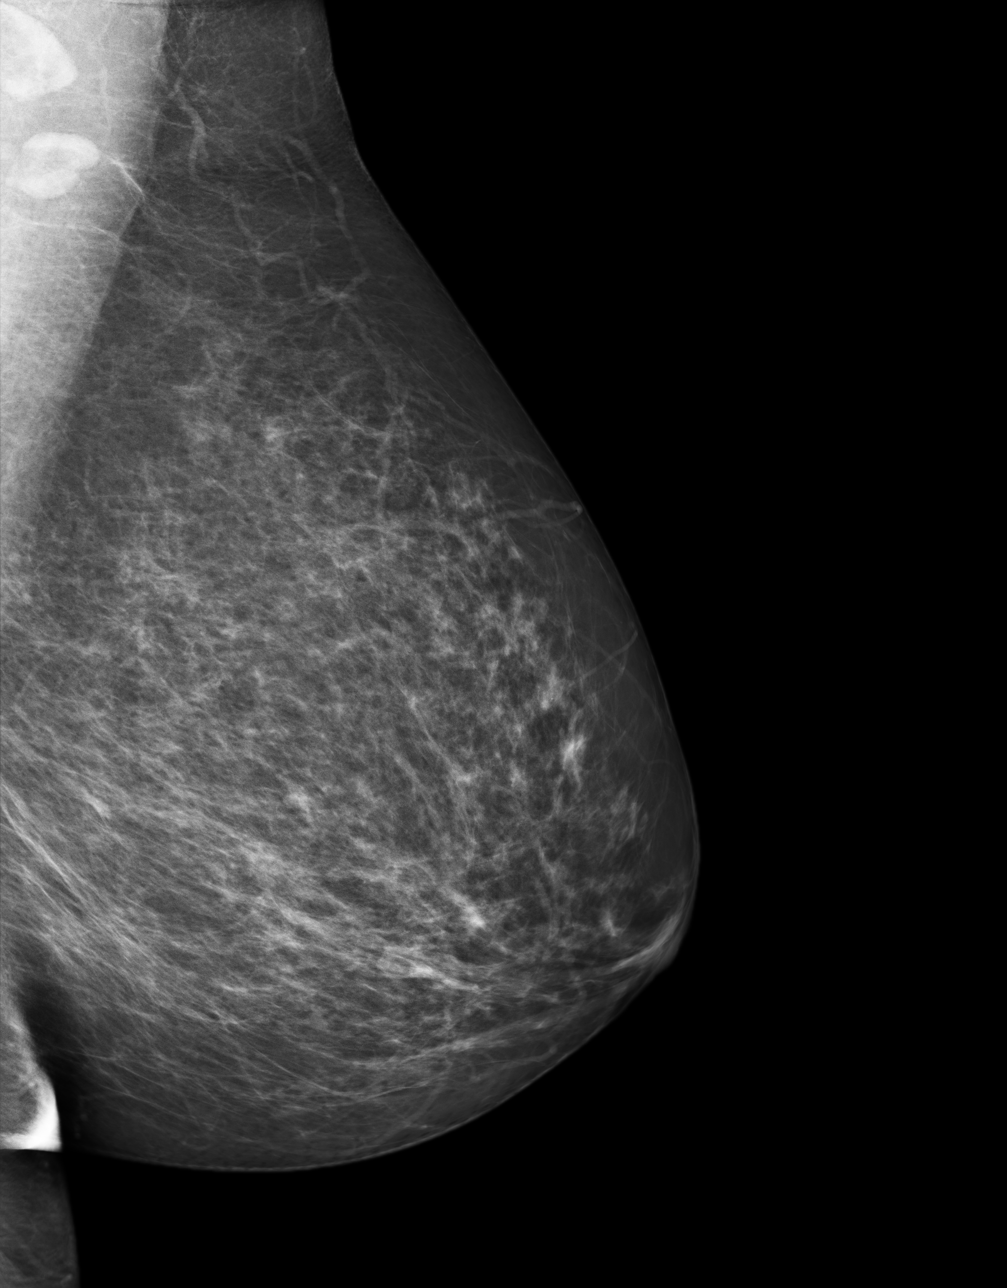

[4 of 4 positions shown; findings below may reference images not displayed]

ACR Breast Density Category c: The breasts are heterogeneously
dense, which may obscure small masses.
FINDINGS: There are no findings suspicious for malignancy. Images were
processed with CAD.
IMPRESSION: No mammographic evidence of malignancy. A result letter of this
screening mammogram will be mailed directly to the patient.

RECOMMENDATION:
Screening mammogram in one year. (Code:[id number])

BI-RADS CATEGORY  1: Negative

## 2014-10-10 ENCOUNTER — Other Ambulatory Visit: Payer: Self-pay

## 2014-10-10 DIAGNOSIS — F411 Generalized anxiety disorder: Secondary | ICD-10-CM

## 2014-10-10 MED ORDER — DIAZEPAM 5 MG PO TABS: 5.0000 mg | ORAL_TABLET | Freq: Four times a day (QID) | ORAL | Status: DC | PRN

## 2014-10-10 NOTE — Telephone Encounter (Signed)
Called into pharmacy

## 2014-11-02 ENCOUNTER — Other Ambulatory Visit (HOSPITAL_BASED_OUTPATIENT_CLINIC_OR_DEPARTMENT_OTHER): Payer: Self-pay | Admitting: Neurosurgery

## 2014-11-02 DIAGNOSIS — M5412 Radiculopathy, cervical region: Secondary | ICD-10-CM

## 2015-03-01 ENCOUNTER — Other Ambulatory Visit: Payer: Self-pay | Admitting: *Deleted

## 2015-03-01 DIAGNOSIS — N946 Dysmenorrhea, unspecified: Secondary | ICD-10-CM

## 2015-03-01 MED ORDER — NORETHIN ACE-ETH ESTRAD-FE 1-20 MG-MCG PO TABS: ORAL_TABLET | ORAL | Status: DC

## 2015-03-01 NOTE — Telephone Encounter (Signed)
Pharmacy called patient needs refill on birth control pills, with new direction stating she take continuously. Rx sent, informed via pharmacy needs annual scheduled.

## 2015-03-15 ENCOUNTER — Other Ambulatory Visit (HOSPITAL_COMMUNITY)
Admission: RE | Admit: 2015-03-15 | Discharge: 2015-03-15 | Disposition: A | Discharge status: Home / Self Care | Payer: PRIVATE HEALTH INSURANCE | Source: Ambulatory Visit | Attending: Women's Health | Admitting: Women's Health

## 2015-03-15 ENCOUNTER — Ambulatory Visit (INDEPENDENT_AMBULATORY_CARE_PROVIDER_SITE_OTHER): Payer: No Typology Code available for payment source | Admitting: Women's Health

## 2015-03-15 ENCOUNTER — Encounter: Payer: Self-pay | Admitting: Women's Health

## 2015-03-15 VITALS — BP 124/80 | Ht 65.0 in | Wt 230.0 lb

## 2015-03-15 DIAGNOSIS — N946 Dysmenorrhea, unspecified: Secondary | ICD-10-CM

## 2015-03-15 DIAGNOSIS — Z01419 Encounter for gynecological examination (general) (routine) without abnormal findings: Secondary | ICD-10-CM | POA: Diagnosis not present

## 2015-03-15 DIAGNOSIS — N951 Menopausal and female climacteric states: Secondary | ICD-10-CM

## 2015-03-15 DIAGNOSIS — Z1151 Encounter for screening for human papillomavirus (HPV): Secondary | ICD-10-CM | POA: Diagnosis not present

## 2015-03-15 MED ORDER — NORETHIN ACE-ETH ESTRAD-FE 1-20 MG-MCG PO TABS: ORAL_TABLET | ORAL | Status: DC

## 2015-03-15 NOTE — Progress Notes (Signed)
[facility]awn[facility]ersam[facility] HospitalF[facility] 10-01-1965 [id number]    History:    Presents for annual exam.  Amenorrhea on Loestrin continuously. Normal Pap and mammogram history. Has history of chronic low back pain. Labs at work reports as normal.  Anxiety and depression stable on Celexa her primary care.  Past medical history, past surgical history, family history and social history were all reviewed and documented in the EPIC chart. Works at [employer]. [name] 10 doing well has ADD. Mother deceased from diabetes at age 50. Father lived until [address][birth year].  ROS:  A ROS was performed and pertinent positives and negatives are included.  Exam:  Filed Vitals:   03/15/15 0901  BP: 124/80    General appearance:  Normal Thyroid:  Symmetrical, normal in size, without palpable masses or nodularity. Respiratory  Auscultation:  Clear without wheezing or rhonchi Cardiovascular  Auscultation:  Regular rate, without rubs, murmurs or gallops  Edema/varicosities:  Not grossly evident Abdominal  Soft,nontender, without masses, guarding or rebound.  Liver/spleen:  No organomegaly noted  Hernia:  None appreciated  Skin  Inspection:  Grossly normal   Breasts: Examined lying and sitting.     Right: Without masses, retractions, discharge or axillary adenopathy.     Left: Without masses, retractions, discharge or axillary adenopathy. Gentitourinary   Inguinal/mons:  Normal without inguinal adenopathy  External genitalia:  Normal  BUS/Urethra/Skene's glands:  Normal  Vagina:  Normal  Cervix:  Normal  Uterus: normal in size, shape and contour.  Midline and mobile  Adnexa/parametria:     Rt: Without masses or tenderness.   Lt: Without masses or tenderness.  Anus and perineum: Normal  Digital rectal exam: Normal sphincter tone without palpated masses or tenderness  Assessment/Plan:  49 y.o. S WF G1 P1 for annual exam with no complaints.  Amenorrhea on Loestrin continuously Screening labs at work Anxiety and depression stable  on Celexa per primary care Obesity  Plan: Loestrin 1/20 prescription, proper use given and reviewed slight risk for blood clots and strokes. Condoms encouraged if sexually active. SBE's, annual screening mammogram overdue instructed to schedule. Aware of need to increase exercise and decrease calories for weight loss for health. FSH, UA, Pap with HR HPV typing, new screening guidelines reviewed.  Harrington ChallengerYOUNG,Zoye Chandra J WHNP, 1:29 PM 03/15/2015

## 2015-03-15 NOTE — Patient Instructions (Signed)
Health Maintenance, Female Adopting a healthy lifestyle and getting preventive care can go a long way to promote health and wellness. Talk with your health care provider about what schedule of regular examinations is right for you. This is a good chance for you to check in with your provider about disease prevention and staying healthy. In between checkups, there are plenty of things you can do on your own. Experts have done a lot of research about which lifestyle changes and preventive measures are most likely to keep you healthy. Ask your health care provider for more information. WEIGHT AND DIET  Eat a healthy diet  Be sure to include plenty of vegetables, fruits, low-fat dairy products, and lean protein.  Do not eat a lot of foods high in solid fats, added sugars, or salt.  Get regular exercise. This is one of the most important things you can do for your health.  Most adults should exercise for at least 150 minutes each week. The exercise should increase your heart rate and make you sweat (moderate-intensity exercise).  Most adults should also do strengthening exercises at least twice a week. This is in addition to the moderate-intensity exercise.  Maintain a healthy weight  Body mass index (BMI) is a measurement that can be used to identify possible weight problems. It estimates body fat based on height and weight. Your health care provider can help determine your BMI and help you achieve or maintain a healthy weight.  For females 20 years of age and older:   A BMI below 18.5 is considered underweight.  A BMI of 18.5 to 24.9 is normal.  A BMI of 25 to 29.9 is considered overweight.  A BMI of 30 and above is considered obese.  Watch levels of cholesterol and blood lipids  You should start having your blood tested for lipids and cholesterol at 50 years of age, then have this test every 5 years.  You may need to have your cholesterol levels checked more often if:  Your lipid  or cholesterol levels are high.  You are older than 50 years of age.  You are at high risk for heart disease.  CANCER SCREENING   Lung Cancer  Lung cancer screening is recommended for adults 55-80 years old who are at high risk for lung cancer because of a history of smoking.  A yearly low-dose CT scan of the lungs is recommended for people who:  Currently smoke.  Have quit within the past 15 years.  Have at least a 30-pack-year history of smoking. A pack year is smoking an average of one pack of cigarettes a day for 1 year.  Yearly screening should continue until it has been 15 years since you quit.  Yearly screening should stop if you develop a health problem that would prevent you from having lung cancer treatment.  Breast Cancer  Practice breast self-awareness. This means understanding how your breasts normally appear and feel.  It also means doing regular breast self-exams. Let your health care provider know about any changes, no matter how small.  If you are in your 20s or 30s, you should have a clinical breast exam (CBE) by a health care provider every 1-3 years as part of a regular health exam.  If you are 40 or older, have a CBE every year. Also consider having a breast X-ray (mammogram) every year.  If you have a family history of breast cancer, talk to your health care provider about genetic screening.  If you   are at high risk for breast cancer, talk to your health care provider about having an MRI and a mammogram every year.  Breast cancer gene (BRCA) assessment is recommended for women who have family members with BRCA-related cancers. BRCA-related cancers include:  Breast.  Ovarian.  Tubal.  Peritoneal cancers.  Results of the assessment will determine the need for genetic counseling and BRCA1 and BRCA2 testing. Cervical Cancer Your health care provider may recommend that you be screened regularly for cancer of the pelvic organs (ovaries, uterus, and  vagina). This screening involves a pelvic examination, including checking for microscopic changes to the surface of your cervix (Pap test). You may be encouraged to have this screening done every 3 years, beginning at age 21.  For women ages 30-65, health care providers may recommend pelvic exams and Pap testing every 3 years, or they may recommend the Pap and pelvic exam, combined with testing for human papilloma virus (HPV), every 5 years. Some types of HPV increase your risk of cervical cancer. Testing for HPV may also be done on women of any age with unclear Pap test results.  Other health care providers may not recommend any screening for nonpregnant women who are considered low risk for pelvic cancer and who do not have symptoms. Ask your health care provider if a screening pelvic exam is right for you.  If you have had past treatment for cervical cancer or a condition that could lead to cancer, you need Pap tests and screening for cancer for at least 20 years after your treatment. If Pap tests have been discontinued, your risk factors (such as having a new sexual partner) need to be reassessed to determine if screening should resume. Some women have medical problems that increase the chance of getting cervical cancer. In these cases, your health care provider may recommend more frequent screening and Pap tests. Colorectal Cancer  This type of cancer can be detected and often prevented.  Routine colorectal cancer screening usually begins at 50 years of age and continues through 50 years of age.  Your health care provider may recommend screening at an earlier age if you have risk factors for colon cancer.  Your health care provider may also recommend using home test kits to check for hidden blood in the stool.  A small camera at the end of a tube can be used to examine your colon directly (sigmoidoscopy or colonoscopy). This is done to check for the earliest forms of colorectal  cancer.  Routine screening usually begins at age 50.  Direct examination of the colon should be repeated every 5-10 years through 50 years of age. However, you may need to be screened more often if early forms of precancerous polyps or small growths are found. Skin Cancer  Check your skin from head to toe regularly.  Tell your health care provider about any new moles or changes in moles, especially if there is a change in a mole's shape or color.  Also tell your health care provider if you have a mole that is larger than the size of a pencil eraser.  Always use sunscreen. Apply sunscreen liberally and repeatedly throughout the day.  Protect yourself by wearing long sleeves, pants, a wide-brimmed hat, and sunglasses whenever you are outside. HEART DISEASE, DIABETES, AND HIGH BLOOD PRESSURE   High blood pressure causes heart disease and increases the risk of stroke. High blood pressure is more likely to develop in:  People who have blood pressure in the high end   of the normal range (130-139/85-89 mm Hg).  People who are overweight or obese.  People who are African American.  If you are 38-23 years of age, have your blood pressure checked every 3-5 years. If you are 61 years of age or older, have your blood pressure checked every year. You should have your blood pressure measured twice--once when you are at a hospital or clinic, and once when you are not at a hospital or clinic. Record the average of the two measurements. To check your blood pressure when you are not at a hospital or clinic, you can use:  An automated blood pressure machine at a pharmacy.  A home blood pressure monitor.  If you are between 45 years and 39 years old, ask your health care provider if you should take aspirin to prevent strokes.  Have regular diabetes screenings. This involves taking a blood sample to check your fasting blood sugar level.  If you are at a normal weight and have a low risk for diabetes,  have this test once every three years after 50 years of age.  If you are overweight and have a high risk for diabetes, consider being tested at a younger age or more often. PREVENTING INFECTION  Hepatitis B  If you have a higher risk for hepatitis B, you should be screened for this virus. You are considered at high risk for hepatitis B if:  You were born in a country where hepatitis B is common. Ask your health care provider which countries are considered high risk.  Your parents were born in a high-risk country, and you have not been immunized against hepatitis B (hepatitis B vaccine).  You have HIV or AIDS.  You use needles to inject street drugs.  You live with someone who has hepatitis B.  You have had sex with someone who has hepatitis B.  You get hemodialysis treatment.  You take certain medicines for conditions, including cancer, organ transplantation, and autoimmune conditions. Hepatitis C  Blood testing is recommended for:  Everyone born from [birth year] through 1966.  Anyone with known risk factors for hepatitis C. Sexually transmitted infections (STIs)  You should be screened for sexually transmitted infections (STIs) including gonorrhea and chlamydia if:  You are sexually active and are younger than 50 years of age.  You are older than 50 years of age and your health care provider tells you that you are at risk for this type of infection.  Your sexual activity has changed since you were last screened and you are at an increased risk for chlamydia or gonorrhea. Ask your health care provider if you are at risk.  If you do not have HIV, but are at risk, it may be recommended that you take a prescription medicine daily to prevent HIV infection. This is called pre-exposure prophylaxis (PrEP). You are considered at risk if:  You are sexually active and do not regularly use condoms or know the HIV status of your partner(s).  You take drugs by injection.  You are sexually  active with a partner who has HIV. Talk with your health care provider about whether you are at high risk of being infected with HIV. If you choose to begin PrEP, you should first be tested for HIV. You should then be tested every 3 months for as long as you are taking PrEP.  PREGNANCY   If you are premenopausal and you may become pregnant, ask your health care provider about preconception counseling.  If you may  become pregnant, take 400 to 800 micrograms (mcg) of folic acid every day.  If you want to prevent pregnancy, talk to your health care provider about birth control (contraception). OSTEOPOROSIS AND MENOPAUSE   Osteoporosis is a disease in which the bones lose minerals and strength with aging. This can result in serious bone fractures. Your risk for osteoporosis can be identified using a bone density scan.  If you are 61 years of age or older, or if you are at risk for osteoporosis and fractures, ask your health care provider if you should be screened.  Ask your health care provider whether you should take a calcium or vitamin D supplement to lower your risk for osteoporosis.  Menopause may have certain physical symptoms and risks.  Hormone replacement therapy may reduce some of these symptoms and risks. Talk to your health care provider about whether hormone replacement therapy is right for you.  HOME CARE INSTRUCTIONS   Schedule regular health, dental, and eye exams.  Stay current with your immunizations.   Do not use any tobacco products including cigarettes, chewing tobacco, or electronic cigarettes.  If you are pregnant, do not drink alcohol.  If you are breastfeeding, limit how much and how often you drink alcohol.  Limit alcohol intake to no more than 1 drink per day for nonpregnant women. One drink equals 12 ounces of beer, 5 ounces of wine, or 1 ounces of hard liquor.  Do not use street drugs.  Do not share needles.  Ask your health care provider for help if  you need support or information about quitting drugs.  Tell your health care provider if you often feel depressed.  Tell your health care provider if you have ever been abused or do not feel safe at home.   This information is not intended to replace advice given to you by your health care provider. Make sure you discuss any questions you have with your health care provider.   Document Released: 11/11/2010 Document Revised: 05/19/2014 Document Reviewed: 03/30/2013 Elsevier Interactive Patient Education [employer].

## 2015-03-16 LAB — URINALYSIS W MICROSCOPIC + REFLEX CULTURE
BILIRUBIN URINE: NEGATIVE
Bacteria, UA: NONE SEEN [HPF]
Casts: NONE SEEN [LPF]
Crystals: NONE SEEN [HPF]
GLUCOSE, UA: NEGATIVE
KETONES UR: NEGATIVE
LEUKOCYTES UA: NEGATIVE
NITRITE: NEGATIVE
PH: 6 (ref 5.0–8.0)
Protein, ur: NEGATIVE
SPECIFIC GRAVITY, URINE: 1.025 (ref 1.001–1.035)
Yeast: NONE SEEN [HPF]

## 2015-03-16 LAB — CYTOLOGY - PAP

## 2015-03-18 LAB — URINE CULTURE

## 2015-04-13 ENCOUNTER — Other Ambulatory Visit: Payer: Self-pay | Admitting: Women's Health

## 2015-04-13 ENCOUNTER — Telehealth: Payer: Self-pay

## 2015-04-13 DIAGNOSIS — F329 Major depressive disorder, single episode, unspecified: Secondary | ICD-10-CM

## 2015-04-13 DIAGNOSIS — F411 Generalized anxiety disorder: Secondary | ICD-10-CM

## 2015-04-13 DIAGNOSIS — F32A Depression, unspecified: Secondary | ICD-10-CM

## 2015-04-13 DIAGNOSIS — N946 Dysmenorrhea, unspecified: Secondary | ICD-10-CM

## 2015-04-13 MED ORDER — DIAZEPAM 5 MG PO TABS: 5.0000 mg | ORAL_TABLET | Freq: Four times a day (QID) | ORAL | Status: DC | PRN

## 2015-04-13 MED ORDER — NORETHIN ACE-ETH ESTRAD-FE 1-20 MG-MCG PO TABS: ORAL_TABLET | ORAL | Status: DC

## 2015-04-13 MED ORDER — CITALOPRAM HYDROBROMIDE 40 MG PO TABS: ORAL_TABLET | ORAL | Status: DC

## 2015-04-13 NOTE — Telephone Encounter (Signed)
Patient said she was in in November to see you and left without getting Rx for her Valium, Celexa or OC.  I do see you sent the OC to her pharmacy at visit.

## 2015-04-13 NOTE — Telephone Encounter (Signed)
Rx's sent. Patient informed. 

## 2015-04-13 NOTE — Telephone Encounter (Signed)
Yes, okay to reorder all, please apologize to her (and to you)

## 2015-04-26 ENCOUNTER — Other Ambulatory Visit: Payer: Self-pay

## 2015-04-26 DIAGNOSIS — F329 Major depressive disorder, single episode, unspecified: Secondary | ICD-10-CM

## 2015-04-26 DIAGNOSIS — F32A Depression, unspecified: Secondary | ICD-10-CM

## 2015-04-26 MED ORDER — CITALOPRAM HYDROBROMIDE 40 MG PO TABS: ORAL_TABLET | ORAL | Status: DC

## 2015-04-27 ENCOUNTER — Other Ambulatory Visit: Payer: Self-pay

## 2015-04-27 DIAGNOSIS — F32A Depression, unspecified: Secondary | ICD-10-CM

## 2015-04-27 DIAGNOSIS — F329 Major depressive disorder, single episode, unspecified: Secondary | ICD-10-CM

## 2015-04-27 MED ORDER — CITALOPRAM HYDROBROMIDE 40 MG PO TABS: ORAL_TABLET | ORAL | Status: DC

## 2015-04-30 ENCOUNTER — Other Ambulatory Visit: Payer: Self-pay

## 2015-04-30 DIAGNOSIS — N946 Dysmenorrhea, unspecified: Secondary | ICD-10-CM

## 2015-04-30 MED ORDER — NORETHIN ACE-ETH ESTRAD-FE 1-20 MG-MCG PO TABS: ORAL_TABLET | ORAL | Status: DC

## 2015-05-01 ENCOUNTER — Other Ambulatory Visit: Payer: Self-pay

## 2015-05-01 DIAGNOSIS — F32A Depression, unspecified: Secondary | ICD-10-CM

## 2015-05-01 DIAGNOSIS — N946 Dysmenorrhea, unspecified: Secondary | ICD-10-CM

## 2015-05-01 DIAGNOSIS — F329 Major depressive disorder, single episode, unspecified: Secondary | ICD-10-CM

## 2015-05-01 MED ORDER — NORETHIN ACE-ETH ESTRAD-FE 1-20 MG-MCG PO TABS: ORAL_TABLET | ORAL | Status: DC

## 2015-05-01 MED ORDER — CITALOPRAM HYDROBROMIDE 40 MG PO TABS: ORAL_TABLET | ORAL | Status: DC

## 2016-02-29 DIAGNOSIS — Z23 Encounter for immunization: Secondary | ICD-10-CM | POA: Diagnosis not present

## 2016-03-13 DIAGNOSIS — D485 Neoplasm of uncertain behavior of skin: Secondary | ICD-10-CM | POA: Diagnosis not present

## 2016-03-13 DIAGNOSIS — L820 Inflamed seborrheic keratosis: Secondary | ICD-10-CM | POA: Diagnosis not present

## 2016-03-13 DIAGNOSIS — L821 Other seborrheic keratosis: Secondary | ICD-10-CM | POA: Diagnosis not present

## 2016-03-18 ENCOUNTER — Encounter: Payer: Self-pay | Admitting: Women's Health

## 2016-03-18 ENCOUNTER — Ambulatory Visit (INDEPENDENT_AMBULATORY_CARE_PROVIDER_SITE_OTHER): Payer: BLUE CROSS/BLUE SHIELD | Admitting: Women's Health

## 2016-03-18 ENCOUNTER — Telehealth: Payer: Self-pay | Admitting: *Deleted

## 2016-03-18 VITALS — BP 128/80 | Ht 65.0 in | Wt 232.0 lb

## 2016-03-18 DIAGNOSIS — N951 Menopausal and female climacteric states: Secondary | ICD-10-CM

## 2016-03-18 DIAGNOSIS — Z01419 Encounter for gynecological examination (general) (routine) without abnormal findings: Secondary | ICD-10-CM | POA: Diagnosis not present

## 2016-03-18 DIAGNOSIS — F3289 Other specified depressive episodes: Secondary | ICD-10-CM

## 2016-03-18 DIAGNOSIS — N946 Dysmenorrhea, unspecified: Secondary | ICD-10-CM

## 2016-03-18 DIAGNOSIS — R21 Rash and other nonspecific skin eruption: Secondary | ICD-10-CM | POA: Diagnosis not present

## 2016-03-18 DIAGNOSIS — Z13220 Encounter for screening for lipoid disorders: Secondary | ICD-10-CM | POA: Diagnosis not present

## 2016-03-18 LAB — COMPREHENSIVE METABOLIC PANEL
ALT: 9 U/L (ref 6–29)
AST: 13 U/L (ref 10–35)
Albumin: 3.9 g/dL (ref 3.6–5.1)
Alkaline Phosphatase: 38 U/L (ref 33–130)
BUN: 10 mg/dL (ref 7–25)
CHLORIDE: 104 mmol/L (ref 98–110)
CO2: 20 mmol/L (ref 20–31)
CREATININE: 0.8 mg/dL (ref 0.50–1.05)
Calcium: 9 mg/dL (ref 8.6–10.4)
GLUCOSE: 94 mg/dL (ref 65–99)
Potassium: 3.9 mmol/L (ref 3.5–5.3)
SODIUM: 137 mmol/L (ref 135–146)
Total Bilirubin: 0.3 mg/dL (ref 0.2–1.2)
Total Protein: 6.7 g/dL (ref 6.1–8.1)

## 2016-03-18 LAB — LIPID PANEL
Cholesterol: 196 mg/dL (ref <200)
HDL: 50 mg/dL — ABNORMAL LOW (ref >50)
LDL CALC: 105 mg/dL — AB
Total CHOL/HDL Ratio: 3.9 Ratio (ref <5.0)
Triglycerides: 203 mg/dL — ABNORMAL HIGH (ref <150)
VLDL: 41 mg/dL — AB (ref <30)

## 2016-03-18 LAB — CBC WITH DIFFERENTIAL/PLATELET
BASOS ABS: 0 {cells}/uL (ref 0–200)
Basophils Relative: 0 %
EOS PCT: 3 %
Eosinophils Absolute: 357 cells/uL (ref 15–500)
HCT: 42.3 % (ref 35.0–45.0)
Hemoglobin: 14.1 g/dL (ref 11.7–15.5)
Lymphocytes Relative: 23 %
Lymphs Abs: 2737 cells/uL (ref 850–3900)
MCH: 29.1 pg (ref 27.0–33.0)
MCHC: 33.3 g/dL (ref 32.0–36.0)
MCV: 87.4 fL (ref 80.0–100.0)
MONOS PCT: 7 %
MPV: 11 fL (ref 7.5–12.5)
Monocytes Absolute: 833 cells/uL (ref 200–950)
NEUTROS ABS: 7973 {cells}/uL — AB (ref 1500–7800)
NEUTROS PCT: 67 %
PLATELETS: 281 10*3/uL (ref 140–400)
RBC: 4.84 MIL/uL (ref 3.80–5.10)
RDW: 13.6 % (ref 11.0–15.0)
WBC: 11.9 10*3/uL — ABNORMAL HIGH (ref 3.8–10.8)

## 2016-03-18 MED ORDER — TRIAMCINOLONE ACETONIDE 0.1 % EX OINT: 1.0000 "application " | TOPICAL_OINTMENT | Freq: Two times a day (BID) | CUTANEOUS | 1 refills | Status: AC

## 2016-03-18 MED ORDER — CITALOPRAM HYDROBROMIDE 40 MG PO TABS: ORAL_TABLET | ORAL | 3 refills | Status: DC

## 2016-03-18 MED ORDER — NORETHIN ACE-ETH ESTRAD-FE 1-20 MG-MCG PO TABS: ORAL_TABLET | ORAL | 3 refills | Status: DC

## 2016-03-18 MED ORDER — NYSTATIN 100000 UNIT/GM EX OINT: 1.0000 "application " | TOPICAL_OINTMENT | Freq: Two times a day (BID) | CUTANEOUS | 1 refills | Status: DC

## 2016-03-18 MED ORDER — NYSTATIN-TRIAMCINOLONE 100000-0.1 UNIT/GM-% EX OINT: 1.0000 "application " | TOPICAL_OINTMENT | Freq: Two times a day (BID) | CUTANEOUS | 1 refills | Status: DC

## 2016-03-18 NOTE — Patient Instructions (Addendum)
Colonoscopy  [id number]  Dr Concha Norway is a normal process in which your reproductive ability comes to an end. This process happens gradually over a span of months to years, usually between the ages of 31 and 27. Menopause is complete when you have missed 12 consecutive menstrual periods. It is important to talk with your health care provider about some of the most common conditions that affect postmenopausal women, such as heart disease, cancer, and bone loss (osteoporosis). Adopting a healthy lifestyle and getting preventive care can help to promote your health and wellness. Those actions can also lower your chances of developing some of these common conditions. WHAT SHOULD I KNOW ABOUT MENOPAUSE? During menopause, you may experience a number of symptoms, such as:  Moderate-to-severe hot flashes.  Night sweats.  Decrease in sex drive.  Mood swings.  Headaches.  Tiredness.  Irritability.  Memory problems.  Insomnia. Choosing to treat or not to treat menopausal changes is an individual decision that you make with your health care provider. WHAT SHOULD I KNOW ABOUT HORMONE REPLACEMENT THERAPY AND SUPPLEMENTS? Hormone therapy products are effective for treating symptoms that are associated with menopause, such as hot flashes and night sweats. Hormone replacement carries certain risks, especially as you become older. If you are thinking about using estrogen or estrogen with progestin treatments, discuss the benefits and risks with your health care provider. WHAT SHOULD I KNOW ABOUT HEART DISEASE AND STROKE? Heart disease, heart attack, and stroke become more likely as you age. This may be due, in part, to the hormonal changes that your body experiences during menopause. These can affect how your body processes dietary fats, triglycerides, and cholesterol. Heart attack and stroke are both medical emergencies. There are many things that you can do to help prevent heart disease  and stroke:  Have your blood pressure checked at least every 1-2 years. High blood pressure causes heart disease and increases the risk of stroke.  If you are 76-23 years old, ask your health care provider if you should take aspirin to prevent a heart attack or a stroke.  Do not use any tobacco products, including cigarettes, chewing tobacco, or electronic cigarettes. If you need help quitting, ask your health care provider.  It is important to eat a healthy diet and maintain a healthy weight.  Be sure to include plenty of vegetables, fruits, low-fat dairy products, and lean protein.  Avoid eating foods that are high in solid fats, added sugars, or salt (sodium).  Get regular exercise. This is one of the most important things that you can do for your health.  Try to exercise for at least 150 minutes each week. The type of exercise that you do should increase your heart rate and make you sweat. This is known as moderate-intensity exercise.  Try to do strengthening exercises at least twice each week. Do these in addition to the moderate-intensity exercise.  Know your numbers.Ask your health care provider to check your cholesterol and your blood glucose. Continue to have your blood tested as directed by your health care provider. WHAT SHOULD I KNOW ABOUT CANCER SCREENING? There are several types of cancer. Take the following steps to reduce your risk and to catch any cancer development as early as possible. Breast Cancer  Practice breast self-awareness.  This means understanding how your breasts normally appear and feel.  It also means doing regular breast self-exams. Let your health care provider know about any changes, no matter how small.  If you are  14 or older, have a clinician do a breast exam (clinical breast exam or CBE) every year. Depending on your age, family history, and medical history, it may be recommended that you also have a yearly breast X-ray (mammogram).  If you  have a family history of breast cancer, talk with your health care provider about genetic screening.  If you are at high risk for breast cancer, talk with your health care provider about having an MRI and a mammogram every year.  Breast cancer (BRCA) gene test is recommended for women who have family members with BRCA-related cancers. Results of the assessment will determine the need for genetic counseling and BRCA1 and for BRCA2 testing. BRCA-related cancers include these types:  Breast. This occurs in males or females.  Ovarian.  Tubal. This may also be called fallopian tube cancer.  Cancer of the abdominal or pelvic lining (peritoneal cancer).  Prostate.  Pancreatic. Cervical, Uterine, and Ovarian Cancer Your health care provider may recommend that you be screened regularly for cancer of the pelvic organs. These include your ovaries, uterus, and vagina. This screening involves a pelvic exam, which includes checking for microscopic changes to the surface of your cervix (Pap test).  For women ages 21-65, health care providers may recommend a pelvic exam and a Pap test every three years. For women ages 43-65, they may recommend the Pap test and pelvic exam, combined with testing for human papilloma virus (HPV), every five years. Some types of HPV increase your risk of cervical cancer. Testing for HPV may also be done on women of any age who have unclear Pap test results.  Other health care providers may not recommend any screening for nonpregnant women who are considered low risk for pelvic cancer and have no symptoms. Ask your health care provider if a screening pelvic exam is right for you.  If you have had past treatment for cervical cancer or a condition that could lead to cancer, you need Pap tests and screening for cancer for at least 20 years after your treatment. If Pap tests have been discontinued for you, your risk factors (such as having a new sexual partner) need to be reassessed  to determine if you should start having screenings again. Some women have medical problems that increase the chance of getting cervical cancer. In these cases, your health care provider may recommend that you have screening and Pap tests more often.  If you have a family history of uterine cancer or ovarian cancer, talk with your health care provider about genetic screening.  If you have vaginal bleeding after reaching menopause, tell your health care provider.  There are currently no reliable tests available to screen for ovarian cancer. Lung Cancer Lung cancer screening is recommended for adults 24-70 years old who are at high risk for lung cancer because of a history of smoking. A yearly low-dose CT scan of the lungs is recommended if you:  Currently smoke.  Have a history of at least 30 pack-years of smoking and you currently smoke or have quit within the past 15 years. A pack-year is smoking an average of one pack of cigarettes per day for one year. Yearly screening should:  Continue until it has been 15 years since you quit.  Stop if you develop a health problem that would prevent you from having lung cancer treatment. Colorectal Cancer  This type of cancer can be detected and can often be prevented.  Routine colorectal cancer screening usually begins at age 21 and  continues through age 75.  If you have risk factors for colon cancer, your health care provider may recommend that you be screened at an earlier age.  If you have a family history of colorectal cancer, talk with your health care provider about genetic screening.  Your health care provider may also recommend using home test kits to check for hidden blood in your stool.  A small camera at the end of a tube can be used to examine your colon directly (sigmoidoscopy or colonoscopy). This is done to check for the earliest forms of colorectal cancer.  Direct examination of the colon should be repeated every 5-10 years until  age 75. However, if early forms of precancerous polyps or small growths are found or if you have a family history or genetic risk for colorectal cancer, you may need to be screened more often. Skin Cancer  Check your skin from head to toe regularly.  Monitor any moles. Be sure to tell your health care provider:  About any new moles or changes in moles, especially if there is a change in a mole's shape or color.  If you have a mole that is larger than the size of a pencil eraser.  If any of your family members has a history of skin cancer, especially at a Philipe Laswell age, talk with your health care provider about genetic screening.  Always use sunscreen. Apply sunscreen liberally and repeatedly throughout the day.  Whenever you are outside, protect yourself by wearing long sleeves, pants, a wide-brimmed hat, and sunglasses. WHAT SHOULD I KNOW ABOUT OSTEOPOROSIS? Osteoporosis is a condition in which bone destruction happens more quickly than new bone creation. After menopause, you may be at an increased risk for osteoporosis. To help prevent osteoporosis or the bone fractures that can happen because of osteoporosis, the following is recommended:  If you are 19-50 years old, get at least 1,000 mg of calcium and at least 600 mg of vitamin D per day.  If you are older than age 50 but younger than age 70, get at least 1,200 mg of calcium and at least 600 mg of vitamin D per day.  If you are older than age 70, get at least 1,200 mg of calcium and at least 800 mg of vitamin D per day. Smoking and excessive alcohol intake increase the risk of osteoporosis. Eat foods that are rich in calcium and vitamin D, and do weight-bearing exercises several times each week as directed by your health care provider. WHAT SHOULD I KNOW ABOUT HOW MENOPAUSE AFFECTS MY MENTAL HEALTH? Depression may occur at any age, but it is more common as you become older. Common symptoms of depression include:  Low or sad  mood.  Changes in sleep patterns.  Changes in appetite or eating patterns.  Feeling an overall lack of motivation or enjoyment of activities that you previously enjoyed.  Frequent crying spells. Talk with your health care provider if you think that you are experiencing depression. WHAT SHOULD I KNOW ABOUT IMMUNIZATIONS? It is important that you get and maintain your immunizations. These include:  Tetanus, diphtheria, and pertussis (Tdap) booster vaccine.  Influenza every year before the flu season begins.  Pneumonia vaccine.  Shingles vaccine. Your health care provider may also recommend other immunizations.   This information is not intended to replace advice given to you by your health care provider. Make sure you discuss any questions you have with your health care provider.   Document Released: 06/20/2005 Document Revised: 05/19/2014 Document   Reviewed: 12/29/2013 Elsevier Interactive Patient Education 2016 [employer] Basic Carbohydrate Counting for Diabetes Mellitus Carbohydrate counting is a method for keeping track of the amount of carbohydrates you eat. Eating carbohydrates naturally increases the level of sugar (glucose) in your blood, so it is important for you to know the amount that is okay for you to have in every meal. Carbohydrate counting helps keep the level of glucose in your blood within normal limits. The amount of carbohydrates allowed is different for every person. A dietitian can help you calculate the amount that is right for you. Once you know the amount of carbohydrates you can have, you can count the carbohydrates in the foods you want to eat. Carbohydrates are found in the following foods:  Grains, such as breads and cereals.  Dried beans and soy products.  Starchy vegetables, such as potatoes, peas, and corn.  Fruit and fruit juices.  Milk and yogurt.  Sweets and snack foods, such as cake, cookies, candy, chips, soft drinks, and fruit  drinks. CARBOHYDRATE COUNTING There are two ways to count the carbohydrates in your food. You can use either of the methods or a combination of both. Reading the "Nutrition Facts" on Packaged Food The "Nutrition Facts" is an area that is included on the labels of almost all packaged food and beverages in the [place]. It includes the serving size of that food or beverage and information about the nutrients in each serving of the food, including the grams (g) of carbohydrate per serving.  Decide the number of servings of this food or beverage that you will be able to eat or drink. Multiply that number of servings by the number of grams of carbohydrate that is listed on the label for that serving. The total will be the amount of carbohydrates you will be having when you eat or drink this food or beverage. Learning Standard Serving Sizes of Food When you eat food that is not packaged or does not include "Nutrition Facts" on the label, you need to measure the servings in order to count the amount of carbohydrates.A serving of most carbohydrate-rich foods contains about 15 g of carbohydrates. The following list includes serving sizes of carbohydrate-rich foods that provide 15 g ofcarbohydrate per serving:   1 slice of bread (1 oz) or 1 six-inch tortilla.    of a hamburger bun or English muffin.  4-6 crackers.   cup unsweetened dry cereal.    cup hot cereal.   cup rice or pasta.    cup mashed potatoes or  of a large baked potato.  1 cup fresh fruit or one small piece of fruit.    cup canned or frozen fruit or fruit juice.  1 cup milk.   cup plain fat-free yogurt or yogurt sweetened with artificial sweeteners.   cup cooked dried beans or starchy vegetable, such as peas, corn, or potatoes.  Decide the number of standard-size servings that you will eat. Multiply that number of servings by 15 (the grams of carbohydrates in that serving). For example, if you eat 2 cups of  strawberries, you will have eaten 2 servings and 30 g of carbohydrates (2 servings x 15 g = 30 g). For foods such as soups and casseroles, in which more than one food is mixed in, you will need to count the carbohydrates in each food that is included. EXAMPLE OF CARBOHYDRATE COUNTING Sample Dinner  3 oz chicken breast.   cup of brown rice.   cup of corn.  1 cup milk.   1 cup strawberries with sugar-free whipped topping.  Carbohydrate Calculation Step 1: Identify the foods that contain carbohydrates:   Rice.   Corn.   Milk.   Strawberries. Step 2:Calculate the number of servings eaten of each:   2 servings of rice.   1 serving of corn.   1 serving of milk.   1 serving of strawberries. Step 3: Multiply each of those number of servings by 15 g:   2 servings of rice x 15 g = 30 g.   1 serving of corn x 15 g = 15 g.   1 serving of milk x 15 g = 15 g.   1 serving of strawberries x 15 g = 15 g. Step 4: Add together all of the amounts to find the total grams of carbohydrates eaten: 30 g + 15 g + 15 g + 15 g = 75 g.   This information is not intended to replace advice given to you by your health care provider. Make sure you discuss any questions you have with your health care provider.   Document Released: 04/28/2005 Document Revised: 05/19/2014 Document Reviewed: 03/25/2013 Elsevier Interactive Patient Education [employer].

## 2016-03-18 NOTE — Telephone Encounter (Signed)
Pt was seen today and 3 Rx was sent to wrong pharmacy, [facility] 1/20, celexa 40 mg, and nystatin-triamcinolone ointment. I am going to send 2 separate Rx for combination one for nystatin and triamcinolone as this medication is very expensive and found to be cheaper if separate. All 3 Rx sent to correct pharmacy [facility].

## 2016-03-18 NOTE — Progress Notes (Signed)
Tawni Millersammy M [facility] Jul 02, 1965 [id number]    History:    Presents for annual exam.  Rare cycle on Loestrin continuously. Normal Pap and mammogram history, overdue for mammogram. Has not had a colonoscopy. Normal lap history. Anxiety and depression stable on Celexa also on Concerta needs a refill of Celexa will follow-up with primary care for Concerta.  Past medical history, past surgical history, family history and social history were all reviewed and documented in the EPIC chart. Quit  job last December has been out of work since unsure of job choice in the future. So we 11 doing well has ADD. Father deceased at age [id number], mother deceased age 51 from complications of diabetes and lung disease.  ROS:  A ROS was performed and pertinent positives and negatives are included.  Exam:  Vitals:   03/18/16 0855  BP: 128/80  Weight: 232 lb (105.2 kg)  Height: 5\' 5"  (1.651 m)   Body mass index is 38.61 kg/m.   General appearance:  Normal Thyroid:  Symmetrical, normal in size, without palpable masses or nodularity. Respiratory  Auscultation:  Clear without wheezing or rhonchi Cardiovascular  Auscultation:  Regular rate, without rubs, murmurs or gallops  Edema/varicosities:  Not grossly evident Abdominal  Soft,nontender, without masses, guarding or rebound.  Liver/spleen:  No organomegaly noted  Hernia:  None appreciated  Skin  Inspection:  Grossly normal   Breasts: Examined lying and sitting.     Right: Without masses, retractions, discharge or axillary adenopathy.     Left: Without masses, retractions, discharge or axillary adenopathy. Gentitourinary   Inguinal/mons:  Normal without inguinal adenopathy  External genitalia:  Normal  BUS/Urethra/Skene's glands:  Normal  Vagina:  Normal  Cervix:  Normal  Uterus:   normal in size, shape and contour.  Midline and mobile  Adnexa/parametria:     Rt: Without masses or tenderness.   Lt: Without masses or tenderness.  Anus and  perineum: Normal  Digital rectal exam: Normal sphincter tone without palpated masses or tenderness  Assessment/Plan:  50 y.o. S WF G1 P1 for annual exam.    Rare cycle on Loestrin continuously Perimenopausal with increased hot flushes Obesity Anxiety and depression stable on Celexa Occasional skin rash under pendulous breasts  Plan: Mycolog ointment twice daily as needed under breasts, continue to keep clean and dry. Has annual skin checks will continue. Loestrin 1/20 prescription, proper use given and reviewed will check FSH. Not sexually active-years. Celexa 40 mg by mouth daily prescription, proper use given and reviewed, reviewed importance of increasing leisure and self-care. SBE's, continue annual screening mammogram overdue instructed to schedule. Screening colonoscopy [facility] information given instructed to schedule. Reviewed importance of weight loss, decreasing carbs and increasing exercise. CBC, FSH, lipid panel, CMP, vitamin D, UA, Pap normal with negative HR HPV 2016, new screening guidelines reviewed.    Harrington ChallengerYOUNG,Alaynah Schutter J [facility] PLLCWHNP, 9:40 AM 03/18/2016

## 2016-03-19 LAB — URINALYSIS W MICROSCOPIC + REFLEX CULTURE
Bilirubin Urine: NEGATIVE
CASTS: NONE SEEN [LPF]
Crystals: NONE SEEN [HPF]
GLUCOSE, UA: NEGATIVE
HGB URINE DIPSTICK: NEGATIVE
Ketones, ur: NEGATIVE
LEUKOCYTES UA: NEGATIVE
NITRITE: NEGATIVE
PH: 6.5 (ref 5.0–8.0)
SPECIFIC GRAVITY, URINE: 1.021 (ref 1.001–1.035)
YEAST: NONE SEEN [HPF]

## 2016-03-19 LAB — VITAMIN D 25 HYDROXY (VIT D DEFICIENCY, FRACTURES): Vit D, 25-Hydroxy: 32 ng/mL (ref 30–100)

## 2016-03-19 LAB — FOLLICLE STIMULATING HORMONE: FSH: 7.8 m[IU]/mL

## 2016-03-20 LAB — URINE CULTURE: ORGANISM ID, BACTERIA: NO GROWTH

## 2016-03-27 DIAGNOSIS — F909 Attention-deficit hyperactivity disorder, unspecified type: Secondary | ICD-10-CM | POA: Diagnosis not present

## 2016-03-27 DIAGNOSIS — F329 Major depressive disorder, single episode, unspecified: Secondary | ICD-10-CM | POA: Diagnosis not present

## 2016-04-12 ENCOUNTER — Other Ambulatory Visit: Payer: Self-pay | Admitting: Women's Health

## 2016-04-12 DIAGNOSIS — N946 Dysmenorrhea, unspecified: Secondary | ICD-10-CM

## 2016-05-06 DIAGNOSIS — J209 Acute bronchitis, unspecified: Secondary | ICD-10-CM | POA: Diagnosis not present

## 2016-06-02 ENCOUNTER — Other Ambulatory Visit: Payer: Self-pay | Admitting: Women's Health

## 2016-06-02 DIAGNOSIS — F32A Depression, unspecified: Secondary | ICD-10-CM

## 2016-06-02 DIAGNOSIS — F329 Major depressive disorder, single episode, unspecified: Secondary | ICD-10-CM

## 2016-06-20 DIAGNOSIS — F902 Attention-deficit hyperactivity disorder, combined type: Secondary | ICD-10-CM | POA: Diagnosis not present

## 2016-07-18 DIAGNOSIS — F902 Attention-deficit hyperactivity disorder, combined type: Secondary | ICD-10-CM | POA: Diagnosis not present

## 2016-09-19 DIAGNOSIS — F902 Attention-deficit hyperactivity disorder, combined type: Secondary | ICD-10-CM | POA: Diagnosis not present

## 2016-09-24 ENCOUNTER — Encounter: Payer: Self-pay | Admitting: Gynecology

## 2016-09-25 DIAGNOSIS — L918 Other hypertrophic disorders of the skin: Secondary | ICD-10-CM | POA: Diagnosis not present

## 2016-09-25 DIAGNOSIS — L818 Other specified disorders of pigmentation: Secondary | ICD-10-CM | POA: Diagnosis not present

## 2016-09-25 DIAGNOSIS — L738 Other specified follicular disorders: Secondary | ICD-10-CM | POA: Diagnosis not present

## 2016-10-23 DIAGNOSIS — D1801 Hemangioma of skin and subcutaneous tissue: Secondary | ICD-10-CM | POA: Diagnosis not present

## 2016-10-23 DIAGNOSIS — L738 Other specified follicular disorders: Secondary | ICD-10-CM | POA: Diagnosis not present

## 2016-12-29 ENCOUNTER — Other Ambulatory Visit: Payer: Self-pay | Admitting: Women's Health

## 2016-12-29 DIAGNOSIS — N946 Dysmenorrhea, unspecified: Secondary | ICD-10-CM

## 2016-12-30 ENCOUNTER — Encounter: Payer: BLUE CROSS/BLUE SHIELD | Admitting: Women's Health

## 2016-12-30 DIAGNOSIS — Z0289 Encounter for other administrative examinations: Secondary | ICD-10-CM

## 2017-02-15 DIAGNOSIS — Z23 Encounter for immunization: Secondary | ICD-10-CM | POA: Diagnosis not present

## 2017-03-06 ENCOUNTER — Other Ambulatory Visit: Payer: Self-pay | Admitting: Women's Health

## 2017-03-06 DIAGNOSIS — N946 Dysmenorrhea, unspecified: Secondary | ICD-10-CM

## 2017-03-11 ENCOUNTER — Other Ambulatory Visit: Payer: Self-pay | Admitting: Women's Health

## 2017-03-11 DIAGNOSIS — F32A Depression, unspecified: Secondary | ICD-10-CM

## 2017-03-11 DIAGNOSIS — F329 Major depressive disorder, single episode, unspecified: Secondary | ICD-10-CM

## 2017-04-13 ENCOUNTER — Other Ambulatory Visit: Payer: Self-pay | Admitting: Women's Health

## 2017-04-13 DIAGNOSIS — F32A Depression, unspecified: Secondary | ICD-10-CM

## 2017-04-13 DIAGNOSIS — F329 Major depressive disorder, single episode, unspecified: Secondary | ICD-10-CM

## 2017-04-28 ENCOUNTER — Encounter: Payer: Self-pay | Admitting: Women's Health

## 2017-04-28 ENCOUNTER — Ambulatory Visit (INDEPENDENT_AMBULATORY_CARE_PROVIDER_SITE_OTHER): Payer: BLUE CROSS/BLUE SHIELD | Admitting: Women's Health

## 2017-04-28 VITALS — BP 132/80 | Ht 65.0 in | Wt 227.0 lb

## 2017-04-28 DIAGNOSIS — Z01419 Encounter for gynecological examination (general) (routine) without abnormal findings: Secondary | ICD-10-CM | POA: Diagnosis not present

## 2017-04-28 DIAGNOSIS — F411 Generalized anxiety disorder: Secondary | ICD-10-CM | POA: Diagnosis not present

## 2017-04-28 DIAGNOSIS — Z13220 Encounter for screening for lipoid disorders: Secondary | ICD-10-CM

## 2017-04-28 DIAGNOSIS — F3289 Other specified depressive episodes: Secondary | ICD-10-CM | POA: Diagnosis not present

## 2017-04-28 MED ORDER — CITALOPRAM HYDROBROMIDE 40 MG PO TABS: ORAL_TABLET | ORAL | 3 refills | Status: DC

## 2017-04-28 MED ORDER — DIAZEPAM 5 MG PO TABS: 5.0000 mg | ORAL_TABLET | Freq: Four times a day (QID) | ORAL | 0 refills | Status: DC | PRN

## 2017-04-28 MED ORDER — NORETHINDRONE-ETH ESTRADIOL 0.5-2.5 MG-MCG PO TABS: 1.0000 | ORAL_TABLET | Freq: Every day | ORAL | 4 refills | Status: DC

## 2017-04-28 NOTE — Patient Instructions (Addendum)
Colonoscopy  [phone]   Dr Carlean Purl  Schedule mammogram   [phone] Menopause and Hormone Replacement Therapy What is hormone replacement therapy? Hormone replacement therapy (HRT) is the use of artificial (synthetic) hormones to replace hormones that your body stops producing during menopause. Menopause is the normal time of life when menstrual periods stop completely and the ovaries stop producing the female hormones estrogen and progesterone. This lack of hormones can affect your health and cause undesirable symptoms. HRT can relieve some of those symptoms. What are my options for HRT? HRT may consist of the synthetic hormones estrogen and progestin, or it may consist of only estrogen (estrogen-only therapy). You and your health care provider will decide which form of HRT is best for you. If you choose to be on HRT and you have a uterus, estrogen and progestin are usually prescribed. Estrogen-only therapy is used for women who do not have a uterus. Possible options for taking HRT include:  Pills.  Patches.  Gels.  Sprays.  Vaginal cream.  Vaginal rings.  Vaginal inserts.  The amount of hormone(s) that you take and how long you take the hormone(s) varies depending on your individual health. It is important to:  Begin HRT with the lowest possible dosage.  Stop HRT as soon as your health care provider tells you to stop.  Work with your health care provider so that you feel informed and comfortable with your decisions.  What are the benefits of HRT? HRT can reduce the frequency and severity of menopausal symptoms. Benefits of HRT vary depending on the menopausal symptoms that you have, the severity of your symptoms, and your overall health. HRT may help to improve the following menopausal symptoms:  Hot flashes and night sweats. These are sudden feelings of heat that spread over the face and body. The skin may turn red, like a blush. Night sweats are hot flashes that happen while  you are sleeping or trying to sleep.  Bone loss (osteoporosis). The body loses calcium more quickly after menopause, causing the bones to become weaker. This can increase the risk for bone breaks (fractures).  Vaginal dryness. The lining of the vagina can become thin and dry, which can cause pain during sexual intercourse or cause infection, burning, or itching.  Urinary tract infections.  Urinary incontinence. This is a decreased ability to control when you urinate.  Irritability.  Short-term memory problems.  What are the risks of HRT? Risks of HRT vary depending on your individual health and medical history. Risks of HRT also depend on whether you receive both estrogen and progestin or you receive estrogen only.HRT may increase the risk of:  Spotting. This is when a small amount of bloodleaks from the vagina unexpectedly.  Endometrial cancer. This cancer is in the lining of the uterus (endometrium).  Breast cancer.  Increased density of breast tissue. This can make it harder to find breast cancer on a breast X-ray (mammogram).  Stroke.  Heart attack.  Blood clots.  Gallbladder disease.  Risks of HRT can increase if you have any of the following conditions:  Endometrial cancer.  Liver disease.  Heart disease.  Breast cancer.  History of blood clots.  History of stroke.  How should I care for myself while I am on HRT?  Take over-the-counter and prescription medicines only as told by your health care provider.  Get mammograms, pelvic exams, and medical checkups as often as told by your health care provider.  Have Pap tests done as often as told  by your health care provider. A Pap test is sometimes called a Pap smear. It is a screening test that is used to check for signs of cancer of the cervix and vagina. A Pap test can also identify the presence of infection or precancerous changes. Pap tests may be done: ? Every 3 years, starting at age 68. ? Every 5 years,  starting after age 49, in combination with testing for human papillomavirus (HPV). ? More often or less often depending on other medical conditions you have, your age, and other risk factors.  It is your responsibility to get your Pap test results. Ask your health care provider or the department performing the test when your results will be ready.  Keep all follow-up visits as told by your health care provider. This is important. When should I seek medical care? Talk with your health care provider if:  You have any of these: ? Pain or swelling in your legs. ? Shortness of breath. ? Chest pain. ? Lumps or changes in your breasts or armpits. ? Slurred speech. ? Pain, burning, or bleeding when you urine.  You develop any of these: ? Unusual vaginal bleeding. ? Dizziness or headaches. ? Weakness or numbness in any part of your arms or legs. ? Pain in your abdomen.  This information is not intended to replace advice given to you by your health care provider. Make sure you discuss any questions you have with your health care provider. Document Released: 01/25/2003 Document Revised: 03/25/2016 Document Reviewed: 10/30/2014 Elsevier Interactive Patient Education  2017 [place] Maintenance for Postmenopausal Women Menopause is a normal process in which your reproductive ability comes to an end. This process happens gradually over a span of months to years, usually between the ages of 32 and 61. Menopause is complete when you have missed 12 consecutive menstrual periods. It is important to talk with your health care provider about some of the most common conditions that affect postmenopausal women, such as heart disease, cancer, and bone loss (osteoporosis). Adopting a healthy lifestyle and getting preventive care can help to promote your health and wellness. Those actions can also lower your chances of developing some of these common conditions. What should I know about  menopause? During menopause, you may experience a number of symptoms, such as:  Moderate-to-severe hot flashes.  Night sweats.  Decrease in sex drive.  Mood swings.  Headaches.  Tiredness.  Irritability.  Memory problems.  Insomnia.  Choosing to treat or not to treat menopausal changes is an individual decision that you make with your health care provider. What should I know about hormone replacement therapy and supplements? Hormone therapy products are effective for treating symptoms that are associated with menopause, such as hot flashes and night sweats. Hormone replacement carries certain risks, especially as you become older. If you are thinking about using estrogen or estrogen with progestin treatments, discuss the benefits and risks with your health care provider. What should I know about heart disease and stroke? Heart disease, heart attack, and stroke become more likely as you age. This may be due, in part, to the hormonal changes that your body experiences during menopause. These can affect how your body processes dietary fats, triglycerides, and cholesterol. Heart attack and stroke are both medical emergencies. There are many things that you can do to help prevent heart disease and stroke:  Have your blood pressure checked at least every 1-2 years. High blood pressure causes heart disease and increases the  risk of stroke.  If you are 74-30 years old, ask your health care provider if you should take aspirin to prevent a heart attack or a stroke.  Do not use any tobacco products, including cigarettes, chewing tobacco, or electronic cigarettes. If you need help quitting, ask your health care provider.  It is important to eat a healthy diet and maintain a healthy weight. ? Be sure to include plenty of vegetables, fruits, low-fat dairy products, and lean protein. ? Avoid eating foods that are high in solid fats, added sugars, or salt (sodium).  Get regular exercise. This  is one of the most important things that you can do for your health. ? Try to exercise for at least 150 minutes each week. The type of exercise that you do should increase your heart rate and make you sweat. This is known as moderate-intensity exercise. ? Try to do strengthening exercises at least twice each week. Do these in addition to the moderate-intensity exercise.  Know your numbers.Ask your health care provider to check your cholesterol and your blood glucose. Continue to have your blood tested as directed by your health care provider.  What should I know about cancer screening? There are several types of cancer. Take the following steps to reduce your risk and to catch any cancer development as early as possible. Breast Cancer  Practice breast self-awareness. ? This means understanding how your breasts normally appear and feel. ? It also means doing regular breast self-exams. Let your health care provider know about any changes, no matter how small.  If you are 48 or older, have a clinician do a breast exam (clinical breast exam or CBE) every year. Depending on your age, family history, and medical history, it may be recommended that you also have a yearly breast X-ray (mammogram).  If you have a family history of breast cancer, talk with your health care provider about genetic screening.  If you are at high risk for breast cancer, talk with your health care provider about having an MRI and a mammogram every year.  Breast cancer (BRCA) gene test is recommended for women who have family members with BRCA-related cancers. Results of the assessment will determine the need for genetic counseling and BRCA1 and for BRCA2 testing. BRCA-related cancers include these types: ? Breast. This occurs in males or females. ? Ovarian. ? Tubal. This may also be called fallopian tube cancer. ? Cancer of the abdominal or pelvic lining (peritoneal cancer). ? Prostate. ? Pancreatic.  Cervical,  Uterine, and Ovarian Cancer Your health care provider may recommend that you be screened regularly for cancer of the pelvic organs. These include your ovaries, uterus, and vagina. This screening involves a pelvic exam, which includes checking for microscopic changes to the surface of your cervix (Pap test).  For women ages 21-65, health care providers may recommend a pelvic exam and a Pap test every three years. For women ages 1-65, they may recommend the Pap test and pelvic exam, combined with testing for human papilloma virus (HPV), every five years. Some types of HPV increase your risk of cervical cancer. Testing for HPV may also be done on women of any age who have unclear Pap test results.  Other health care providers may not recommend any screening for nonpregnant women who are considered low risk for pelvic cancer and have no symptoms. Ask your health care provider if a screening pelvic exam is right for you.  If you have had past treatment for cervical cancer or  a condition that could lead to cancer, you need Pap tests and screening for cancer for at least 20 years after your treatment. If Pap tests have been discontinued for you, your risk factors (such as having a new sexual partner) need to be reassessed to determine if you should start having screenings again. Some women have medical problems that increase the chance of getting cervical cancer. In these cases, your health care provider may recommend that you have screening and Pap tests more often.  If you have a family history of uterine cancer or ovarian cancer, talk with your health care provider about genetic screening.  If you have vaginal bleeding after reaching menopause, tell your health care provider.  There are currently no reliable tests available to screen for ovarian cancer.  Lung Cancer Lung cancer screening is recommended for adults 96-84 years old who are at high risk for lung cancer because of a history of smoking. A  yearly low-dose CT scan of the lungs is recommended if you:  Currently smoke.  Have a history of at least 30 pack-years of smoking and you currently smoke or have quit within the past 15 years. A pack-year is smoking an average of one pack of cigarettes per day for one year.  Yearly screening should:  Continue until it has been 15 years since you quit.  Stop if you develop a health problem that would prevent you from having lung cancer treatment.  Colorectal Cancer  This type of cancer can be detected and can often be prevented.  Routine colorectal cancer screening usually begins at age 42 and continues through age 51.  If you have risk factors for colon cancer, your health care provider may recommend that you be screened at an earlier age.  If you have a family history of colorectal cancer, talk with your health care provider about genetic screening.  Your health care provider may also recommend using home test kits to check for hidden blood in your stool.  A small camera at the end of a tube can be used to examine your colon directly (sigmoidoscopy or colonoscopy). This is done to check for the earliest forms of colorectal cancer.  Direct examination of the colon should be repeated every 5-10 years until age 20. However, if early forms of precancerous polyps or small growths are found or if you have a family history or genetic risk for colorectal cancer, you may need to be screened more often.  Skin Cancer  Check your skin from head to toe regularly.  Monitor any moles. Be sure to tell your health care provider: ? About any new moles or changes in moles, especially if there is a change in a mole's shape or color. ? If you have a mole that is larger than the size of a pencil eraser.  If any of your family members has a history of skin cancer, especially at a young age, talk with your health care provider about genetic screening.  Always use sunscreen. Apply sunscreen liberally  and repeatedly throughout the day.  Whenever you are outside, protect yourself by wearing long sleeves, pants, a wide-brimmed hat, and sunglasses.  What should I know about osteoporosis? Osteoporosis is a condition in which bone destruction happens more quickly than new bone creation. After menopause, you may be at an increased risk for osteoporosis. To help prevent osteoporosis or the bone fractures that can happen because of osteoporosis, the following is recommended:  If you are 51-61 years old, get at  least 1,000 mg of calcium and at least 600 mg of vitamin D per day.  If you are older than age 41 but younger than age 75, get at least 1,200 mg of calcium and at least 600 mg of vitamin D per day.  If you are older than age 52, get at least 1,200 mg of calcium and at least 800 mg of vitamin D per day.  Smoking and excessive alcohol intake increase the risk of osteoporosis. Eat foods that are rich in calcium and vitamin D, and do weight-bearing exercises several times each week as directed by your health care provider. What should I know about how menopause affects my mental health? Depression may occur at any age, but it is more common as you become older. Common symptoms of depression include:  Low or sad mood.  Changes in sleep patterns.  Changes in appetite or eating patterns.  Feeling an overall lack of motivation or enjoyment of activities that you previously enjoyed.  Frequent crying spells.  Talk with your health care provider if you think that you are experiencing depression. What should I know about immunizations? It is important that you get and maintain your immunizations. These include:  Tetanus, diphtheria, and pertussis (Tdap) booster vaccine.  Influenza every year before the flu season begins.  Pneumonia vaccine.  Shingles vaccine.  Your health care provider may also recommend other immunizations. This information is not intended to replace advice given to you  by your health care provider. Make sure you discuss any questions you have with your health care provider. Document Released: 06/20/2005 Document Revised: 11/16/2015 Document Reviewed: 01/30/2015 Elsevier Interactive Patient Education  2018 [employer].

## 2017-04-28 NOTE — Progress Notes (Signed)
[facility] 04-Nov-1965 [id number]    History:    Presents for annual exam.  Amenorrheic on Junel continuously with increased hot flushes. Not sexually active greater than one year. Anxiety and depression stable on Celexa. Normal Pap and mammogram history, overdue for mammogram. Has not had a screening colonoscopy.  Past medical history, past surgical history, family history and social history were all reviewed and documented in the EPIC chart. 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 [name] 13, doing well. Father deceased at age 80100. Mother deceased age 52 from diabetes and lung cancer.  ROS:  A ROS was performed and pertinent positives and negatives are included.  Exam:  Vitals:   04/28/17 0806  BP: 132/80  Weight: 227 lb (103 kg)  Height: 5\' 5"  (1.651 m)   Body mass index is 37.77 kg/m.   General appearance:  Normal Thyroid:  Symmetrical, normal in size, without palpable masses or nodularity. Respiratory  Auscultation:  Clear without wheezing or rhonchi Cardiovascular  Auscultation:  Regular rate, without rubs, murmurs or gallops  Edema/varicosities:  Not grossly evident Abdominal  Soft,nontender, without masses, guarding or rebound.  Liver/spleen:  No organomegaly noted  Hernia:  None appreciated  Skin  Inspection:  Grossly normal   Breasts: Examined lying and sitting.     Right: Without masses, retractions, discharge or axillary adenopathy.     Left: Without masses, retractions, discharge or axillary adenopathy. Gentitourinary   Inguinal/mons:  Normal without inguinal adenopathy  External genitalia:  Normal  BUS/Urethra/Skene's glands:  Normal  Vagina:  Normal  Cervix:  Normal  Uterus:  normal in size, shape and contour.  Midline and mobile  Adnexa/parametria:     Rt: Without masses or tenderness.   Lt: Without masses or tenderness.  Anus and perineum: Normal  Digital rectal exam: Normal sphincter tone without palpated masses or  tenderness  Assessment/Plan:  51 y.o. S WF G1 P1  for annual exam.     Perimenopausal Anxiety and depression stable on Celexa Obesity  Plan: Options reviewed, will change from OCs to Femhrt  0.5/2.5. Instructed to call if problems with bleeding. Reviewed slight risk for blood clots, strokes and breast cancer. Reviewed importance of scheduling annual screening mammogram. Increase regular exercise and decrease calorie/carbs for weight loss encouraged. Celexa 40 mg by mouth daily prescription, proper use given and reviewed. Counseling as needed. Leisure activities encouraged.. CBC, CMP, hemoglobin A1c, lipid panel. Pap normal 2016, new screening guidelines reviewed.    Harrington Challengerancy J Young [facility] HospitalWHNP, 12:04 PM 04/28/2017

## 2017-04-29 LAB — CBC WITH DIFFERENTIAL/PLATELET
BASOS PCT: 0.7 %
Basophils Absolute: 85 cells/uL (ref 0–200)
EOS ABS: 354 {cells}/uL (ref 15–500)
EOS PCT: 2.9 %
HCT: 42.3 % (ref 35.0–45.0)
Hemoglobin: 14.3 g/dL (ref 11.7–15.5)
Lymphs Abs: 2550 cells/uL (ref 850–3900)
MCH: 28.6 pg (ref 27.0–33.0)
MCHC: 33.8 g/dL (ref 32.0–36.0)
MCV: 84.6 fL (ref 80.0–100.0)
MONOS PCT: 6.7 %
MPV: 12.1 fL (ref 7.5–12.5)
NEUTROS ABS: 8394 {cells}/uL — AB (ref 1500–7800)
Neutrophils Relative %: 68.8 %
PLATELETS: 281 10*3/uL (ref 140–400)
RBC: 5 10*6/uL (ref 3.80–5.10)
RDW: 12.6 % (ref 11.0–15.0)
TOTAL LYMPHOCYTE: 20.9 %
WBC mixed population: 817 cells/uL (ref 200–950)
WBC: 12.2 10*3/uL — ABNORMAL HIGH (ref 3.8–10.8)

## 2017-04-29 LAB — HEMOGLOBIN A1C
HEMOGLOBIN A1C: 5.7 %{Hb} — AB (ref <5.7)
Mean Plasma Glucose: 117 (calc)
eAG (mmol/L): 6.5 (calc)

## 2017-04-29 LAB — COMPREHENSIVE METABOLIC PANEL
AG Ratio: 1.4 (calc) (ref 1.0–2.5)
ALT: 13 U/L (ref 6–29)
AST: 14 U/L (ref 10–35)
Albumin: 4.1 g/dL (ref 3.6–5.1)
Alkaline phosphatase (APISO): 48 U/L (ref 33–130)
BUN: 9 mg/dL (ref 7–25)
CO2: 26 mmol/L (ref 20–32)
Calcium: 9.3 mg/dL (ref 8.6–10.4)
Chloride: 102 mmol/L (ref 98–110)
Creat: 0.87 mg/dL (ref 0.50–1.05)
Globulin: 3 g/dL (calc) (ref 1.9–3.7)
Glucose, Bld: 106 mg/dL — ABNORMAL HIGH (ref 65–99)
Potassium: 3.6 mmol/L (ref 3.5–5.3)
Sodium: 137 mmol/L (ref 135–146)
Total Bilirubin: 0.5 mg/dL (ref 0.2–1.2)
Total Protein: 7.1 g/dL (ref 6.1–8.1)

## 2017-04-29 LAB — LIPID PANEL
CHOL/HDL RATIO: 3.8 (calc) (ref <5.0)
Cholesterol: 212 mg/dL — ABNORMAL HIGH (ref <200)
HDL: 56 mg/dL (ref >50)
LDL CHOLESTEROL (CALC): 130 mg/dL — AB
Non-HDL Cholesterol (Calc): 156 mg/dL (calc) — ABNORMAL HIGH (ref <130)
TRIGLYCERIDES: 144 mg/dL (ref <150)

## 2017-04-30 ENCOUNTER — Other Ambulatory Visit: Payer: Self-pay | Admitting: Women's Health

## 2017-04-30 DIAGNOSIS — E7800 Pure hypercholesterolemia, unspecified: Secondary | ICD-10-CM

## 2017-09-17 DIAGNOSIS — F902 Attention-deficit hyperactivity disorder, combined type: Secondary | ICD-10-CM | POA: Diagnosis not present

## 2017-12-28 ENCOUNTER — Other Ambulatory Visit: Payer: Self-pay | Admitting: Gynecology

## 2017-12-28 DIAGNOSIS — F411 Generalized anxiety disorder: Secondary | ICD-10-CM

## 2017-12-28 NOTE — Telephone Encounter (Signed)
ok 

## 2017-12-28 NOTE — Telephone Encounter (Signed)
Called into pharmacy

## 2018-01-21 DIAGNOSIS — Z1231 Encounter for screening mammogram for malignant neoplasm of breast: Secondary | ICD-10-CM | POA: Diagnosis not present

## 2018-03-22 DIAGNOSIS — Z23 Encounter for immunization: Secondary | ICD-10-CM | POA: Diagnosis not present

## 2018-06-02 ENCOUNTER — Encounter: Payer: Self-pay | Admitting: Women's Health

## 2018-06-02 ENCOUNTER — Ambulatory Visit (INDEPENDENT_AMBULATORY_CARE_PROVIDER_SITE_OTHER): Payer: BLUE CROSS/BLUE SHIELD | Admitting: Women's Health

## 2018-06-02 VITALS — BP 118/80 | Ht 65.0 in | Wt 227.0 lb

## 2018-06-02 DIAGNOSIS — Z833 Family history of diabetes mellitus: Secondary | ICD-10-CM

## 2018-06-02 DIAGNOSIS — F3289 Other specified depressive episodes: Secondary | ICD-10-CM | POA: Diagnosis not present

## 2018-06-02 DIAGNOSIS — Z01419 Encounter for gynecological examination (general) (routine) without abnormal findings: Secondary | ICD-10-CM

## 2018-06-02 DIAGNOSIS — Z13220 Encounter for screening for lipoid disorders: Secondary | ICD-10-CM

## 2018-06-02 MED ORDER — NYSTATIN 100000 UNIT/GM EX OINT: 1.0000 "application " | TOPICAL_OINTMENT | Freq: Two times a day (BID) | CUTANEOUS | 1 refills | Status: AC

## 2018-06-02 MED ORDER — CITALOPRAM HYDROBROMIDE 40 MG PO TABS: ORAL_TABLET | ORAL | 3 refills | Status: DC

## 2018-06-02 NOTE — Progress Notes (Signed)
Audrey Schneider [facility] [birth year] [id number]    History:    Presents for annual exam.  Postmenopausal on no HRT with no bleeding.  Normal Pap and mammogram history.  Not sexually active for years, denies need for STD screen.  Has not had a screening colonoscopy.  Anxiety/depression on Celexa doing well.  Has had counseling.  Past medical history, past surgical history, family history and social history were all reviewed and documented in the EPIC chart. Not working. Audrey Schneider 14 doing well.  Mother deceased from diabetes and lung cancer.  Father deceased at age 62.  ROS:  A ROS was performed and pertinent positives and negatives are included.  Exam:  Vitals:   06/02/18 1130  BP: 118/80  Weight: 227 lb (103 kg)  Height: 5\' 5"  (1.651 m)   Body mass index is 37.77 kg/m.   General appearance:  Normal Thyroid:  Symmetrical, normal in size, without palpable masses or nodularity. Respiratory  Auscultation:  Clear without wheezing or rhonchi Cardiovascular  Auscultation:  Regular rate, without rubs, murmurs or gallops  Edema/varicosities:  Not grossly evident Abdominal  Soft,nontender, without masses, guarding or rebound.  Liver/spleen:  No organomegaly noted  Hernia:  None appreciated  Skin  Inspection:  Grossly normal   Breasts: Examined lying and sitting.     Right: Without masses, retractions, discharge or axillary adenopathy.     Left: Without masses, retractions, discharge or axillary adenopathy. Gentitourinary   Inguinal/mons:  Normal without inguinal adenopathy  External genitalia:  Normal  BUS/Urethra/Skene's glands:  Normal  Vagina:  Normal  Cervix:  Normal  Uterus:  normal in size, shape and contour.  Midline and mobile  Adnexa/parametria:     Rt: Without masses or tenderness.   Lt: Without masses or tenderness.  Anus and perineum: Normal  Digital rectal exam: Normal sphincter tone without palpated masses or tenderness  Assessment/Plan:  53 y.o. S WF G1, P1 for annual  exam with no complaints.  Postmenopausal on no HRT with no bleeding.   Obesity Anxiety/depression stable on Celexa  Plan: Celexa 40 mg p.o. daily prescription, proper use given and reviewed.  Counseling as needed.  SBEs, continue annual 3D screening mammogram, calcium rich foods, vitamin D 2000 daily encouraged.  Reviewed importance of increasing regular exercise and decreasing calorie/carbs.  Screening colonoscopy reviewed and encouraged [facility] information given instructed to schedule.  CBC, CMP, lipid panel, hemoglobin A1c.  Pap with HR HPV typing, new screening guidelines reviewed.    Harrington Challenger [facility], 11:36 AM 06/02/2018

## 2018-06-02 NOTE — Patient Instructions (Addendum)
[facility]  [phone]  Dr Carlean Purl  Vit D 2000 iu daily    Carbohydrate Counting for Diabetes Mellitus, Adult  Carbohydrate counting is a method of keeping track of how many carbohydrates you eat. Eating carbohydrates naturally increases the amount of sugar (glucose) in the blood. Counting how many carbohydrates you eat helps keep your blood glucose within normal limits, which helps you manage your diabetes (diabetes mellitus). It is important to know how many carbohydrates you can safely have in each meal. This is different for every person. A diet and nutrition specialist (registered dietitian) can help you make a meal plan and calculate how many carbohydrates you should have at each meal and snack. Carbohydrates are found in the following foods:  Grains, such as breads and cereals.  Dried beans and soy products.  Starchy vegetables, such as potatoes, peas, and corn.  Fruit and fruit juices.  Milk and yogurt.  Sweets and snack foods, such as cake, cookies, candy, chips, and soft drinks. How do I count carbohydrates? There are two ways to count carbohydrates in food. You can use either of the methods or a combination of both. Reading "Nutrition Facts" on packaged food The "Nutrition Facts" list is included on the labels of almost all packaged foods and beverages in the [place] It includes:  The serving size.  Information about nutrients in each serving, including the grams (g) of carbohydrate per serving. To use the "Nutrition Facts":  Decide how many servings you will have.  Multiply the number of servings by the number of carbohydrates per serving.  The resulting number is the total amount of carbohydrates that you will be having. Learning standard serving sizes of other foods When you eat carbohydrate foods that are not packaged or do not include "Nutrition Facts" on the label, you need to measure the servings in order to count the amount of carbohydrates:  Measure the foods  that you will eat with a food scale or measuring cup, if needed.  Decide how many standard-size servings you will eat.  Multiply the number of servings by 15. Most carbohydrate-rich foods have about 15 g of carbohydrates per serving. ? For example, if you eat 8 oz (170 g) of strawberries, you will have eaten 2 servings and 30 g of carbohydrates (2 servings x 15 g = 30 g).  For foods that have more than one food mixed, such as soups and casseroles, you must count the carbohydrates in each food that is included. The following list contains standard serving sizes of common carbohydrate-rich foods. Each of these servings has about 15 g of carbohydrates:   hamburger bun or  English muffin.   oz (15 mL) syrup.   oz (14 g) jelly.  1 slice of bread.  1 six-inch tortilla.  3 oz (85 g) cooked rice or pasta.  4 oz (113 g) cooked dried beans.  4 oz (113 g) starchy vegetable, such as peas, corn, or potatoes.  4 oz (113 g) hot cereal.  4 oz (113 g) mashed potatoes or  of a large baked potato.  4 oz (113 g) canned or frozen fruit.  4 oz (120 mL) fruit juice.  4-6 crackers.  6 chicken nuggets.  6 oz (170 g) unsweetened dry cereal.  6 oz (170 g) plain fat-free yogurt or yogurt sweetened with artificial sweeteners.  8 oz (240 mL) milk.  8 oz (170 g) fresh fruit or one small piece of fruit.  24 oz (680 g) popped popcorn. Example of  carbohydrate counting Sample meal  3 oz (85 g) chicken breast.  6 oz (170 g) brown rice.  4 oz (113 g) corn.  8 oz (240 mL) milk.  8 oz (170 g) strawberries with sugar-free whipped topping. Carbohydrate calculation 1. Identify the foods that contain carbohydrates: ? Rice. ? Corn. ? Milk. ? Strawberries. 2. Calculate how many servings you have of each food: ? 2 servings rice. ? 1 serving corn. ? 1 serving milk. ? 1 serving strawberries. 3. Multiply each number of servings by 15 g: ? 2 servings rice x 15 g = 30 g. ? 1 serving corn x  15 g = 15 g. ? 1 serving milk x 15 g = 15 g. ? 1 serving strawberries x 15 g = 15 g. 4. Add together all of the amounts to find the total grams of carbohydrates eaten: ? 30 g + 15 g + 15 g + 15 g = 75 g of carbohydrates total. Summary  Carbohydrate counting is a method of keeping track of how many carbohydrates you eat.  Eating carbohydrates naturally increases the amount of sugar (glucose) in the blood.  Counting how many carbohydrates you eat helps keep your blood glucose within normal limits, which helps you manage your diabetes.  A diet and nutrition specialist (registered dietitian) can help you make a meal plan and calculate how many carbohydrates you should have at each meal and snack. This information is not intended to replace advice given to you by your health care provider. Make sure you discuss any questions you have with your health care provider. Document Released: 04/28/2005 Document Revised: 11/05/2016 Document Reviewed: 10/10/2015 Elsevier Interactive Patient Education  2019 [place] Maintenance for Postmenopausal Women Menopause is a normal process in which your reproductive ability comes to an end. This process happens gradually over a span of months to years, usually between the ages of 92 and 65. Menopause is complete when you have missed 12 consecutive menstrual periods. It is important to talk with your health care provider about some of the most common conditions that affect postmenopausal women, such as heart disease, cancer, and bone loss (osteoporosis). Adopting a healthy lifestyle and getting preventive care can help to promote your health and wellness. Those actions can also lower your chances of developing some of these common conditions. What should I know about menopause? During menopause, you may experience a number of symptoms, such as:  Moderate-to-severe hot flashes.  Night sweats.  Decrease in sex drive.  Mood  swings.  Headaches.  Tiredness.  Irritability.  Memory problems.  Insomnia. Choosing to treat or not to treat menopausal changes is an individual decision that you make with your health care provider. What should I know about hormone replacement therapy and supplements? Hormone therapy products are effective for treating symptoms that are associated with menopause, such as hot flashes and night sweats. Hormone replacement carries certain risks, especially as you become older. If you are thinking about using estrogen or estrogen with progestin treatments, discuss the benefits and risks with your health care provider. What should I know about heart disease and stroke? Heart disease, heart attack, and stroke become more likely as you age. This may be due, in part, to the hormonal changes that your body experiences during menopause. These can affect how your body processes dietary fats, triglycerides, and cholesterol. Heart attack and stroke are both medical emergencies. There are many things that you can do to help prevent heart disease and stroke:  Have your blood pressure checked at least every 1-2 years. High blood pressure causes heart disease and increases the risk of stroke.  If you are 37-66 years old, ask your health care provider if you should take aspirin to prevent a heart attack or a stroke.  Do not use any tobacco products, including cigarettes, chewing tobacco, or electronic cigarettes. If you need help quitting, ask your health care provider.  It is important to eat a healthy diet and maintain a healthy weight. ? Be sure to include plenty of vegetables, fruits, low-fat dairy products, and lean protein. ? Avoid eating foods that are high in solid fats, added sugars, or salt (sodium).  Get regular exercise. This is one of the most important things that you can do for your health. ? Try to exercise for at least 150 minutes each week. The type of exercise that you do should  increase your heart rate and make you sweat. This is known as moderate-intensity exercise. ? Try to do strengthening exercises at least twice each week. Do these in addition to the moderate-intensity exercise.  Know your numbers.Ask your health care provider to check your cholesterol and your blood glucose. Continue to have your blood tested as directed by your health care provider.  What should I know about cancer screening? There are several types of cancer. Take the following steps to reduce your risk and to catch any cancer development as early as possible. Breast Cancer  Practice breast self-awareness. ? This means understanding how your breasts normally appear and feel. ? It also means doing regular breast self-exams. Let your health care provider know about any changes, no matter how small.  If you are 58 or older, have a clinician do a breast exam (clinical breast exam or CBE) every year. Depending on your age, family history, and medical history, it may be recommended that you also have a yearly breast X-ray (mammogram).  If you have a family history of breast cancer, talk with your health care provider about genetic screening.  If you are at high risk for breast cancer, talk with your health care provider about having an MRI and a mammogram every year.  Breast cancer (BRCA) gene test is recommended for women who have family members with BRCA-related cancers. Results of the assessment will determine the need for genetic counseling and BRCA1 and for BRCA2 testing. BRCA-related cancers include these types: ? Breast. This occurs in males or females. ? Ovarian. ? Tubal. This may also be called fallopian tube cancer. ? Cancer of the abdominal or pelvic lining (peritoneal cancer). ? Prostate. ? Pancreatic. Cervical, Uterine, and Ovarian Cancer Your health care provider may recommend that you be screened regularly for cancer of the pelvic organs. These include your ovaries, uterus, and  vagina. This screening involves a pelvic exam, which includes checking for microscopic changes to the surface of your cervix (Pap test).  For women ages 21-65, health care providers may recommend a pelvic exam and a Pap test every three years. For women ages 5-65, they may recommend the Pap test and pelvic exam, combined with testing for human papilloma virus (HPV), every five years. Some types of HPV increase your risk of cervical cancer. Testing for HPV may also be done on women of any age who have unclear Pap test results.  Other health care providers may not recommend any screening for nonpregnant women who are considered low risk for pelvic cancer and have no symptoms. Ask your health care provider if a  screening pelvic exam is right for you.  If you have had past treatment for cervical cancer or a condition that could lead to cancer, you need Pap tests and screening for cancer for at least 20 years after your treatment. If Pap tests have been discontinued for you, your risk factors (such as having a new sexual partner) need to be reassessed to determine if you should start having screenings again. Some women have medical problems that increase the chance of getting cervical cancer. In these cases, your health care provider may recommend that you have screening and Pap tests more often.  If you have a family history of uterine cancer or ovarian cancer, talk with your health care provider about genetic screening.  If you have vaginal bleeding after reaching menopause, tell your health care provider.  There are currently no reliable tests available to screen for ovarian cancer. Lung Cancer Lung cancer screening is recommended for adults 84-65 years old who are at high risk for lung cancer because of a history of smoking. A yearly low-dose CT scan of the lungs is recommended if you:  Currently smoke.  Have a history of at least 30 pack-years of smoking and you currently smoke or have quit within  the past 15 years. A pack-year is smoking an average of one pack of cigarettes per day for one year. Yearly screening should:  Continue until it has been 15 years since you quit.  Stop if you develop a health problem that would prevent you from having lung cancer treatment. Colorectal Cancer  This type of cancer can be detected and can often be prevented.  Routine colorectal cancer screening usually begins at age 90 and continues through age 55.  If you have risk factors for colon cancer, your health care provider may recommend that you be screened at an earlier age.  If you have a family history of colorectal cancer, talk with your health care provider about genetic screening.  Your health care provider may also recommend using home test kits to check for hidden blood in your stool.  A small camera at the end of a tube can be used to examine your colon directly (sigmoidoscopy or colonoscopy). This is done to check for the earliest forms of colorectal cancer.  Direct examination of the colon should be repeated every 5-10 years until age 65. However, if early forms of precancerous polyps or small growths are found or if you have a family history or genetic risk for colorectal cancer, you may need to be screened more often. Skin Cancer  Check your skin from head to toe regularly.  Monitor any moles. Be sure to tell your health care provider: ? About any new moles or changes in moles, especially if there is a change in a mole's shape or color. ? If you have a mole that is larger than the size of a pencil eraser.  If any of your family members has a history of skin cancer, especially at a  age, talk with your health care provider about genetic screening.  Always use sunscreen. Apply sunscreen liberally and repeatedly throughout the day.  Whenever you are outside, protect yourself by wearing long sleeves, pants, a wide-brimmed hat, and sunglasses. What should I know about  osteoporosis? Osteoporosis is a condition in which bone destruction happens more quickly than new bone creation. After menopause, you may be at an increased risk for osteoporosis. To help prevent osteoporosis or the bone fractures that can happen because of osteoporosis,  the following is recommended:  If you are 37-69 years old, get at least 1,000 mg of calcium and at least 600 mg of vitamin D per day.  If you are older than age 68 but younger than age 27, get at least 1,200 mg of calcium and at least 600 mg of vitamin D per day.  If you are older than age 67, get at least 1,200 mg of calcium and at least 800 mg of vitamin D per day. Smoking and excessive alcohol intake increase the risk of osteoporosis. Eat foods that are rich in calcium and vitamin D, and do weight-bearing exercises several times each week as directed by your health care provider. What should I know about how menopause affects my mental health? Depression may occur at any age, but it is more common as you become older. Common symptoms of depression include:  Low or sad mood.  Changes in sleep patterns.  Changes in appetite or eating patterns.  Feeling an overall lack of motivation or enjoyment of activities that you previously enjoyed.  Frequent crying spells. Talk with your health care provider if you think that you are experiencing depression. What should I know about immunizations? It is important that you get and maintain your immunizations. These include:  Tetanus, diphtheria, and pertussis (Tdap) booster vaccine.  Influenza every year before the flu season begins.  Pneumonia vaccine.  Shingles vaccine. Your health care provider may also recommend other immunizations. This information is not intended to replace advice given to you by your health care provider. Make sure you discuss any questions you have with your health care provider. Document Released: 06/20/2005 Document Revised: 11/16/2015 Document  Reviewed: 01/30/2015 Elsevier Interactive Patient Education  2019 [employer].

## 2018-06-03 LAB — PAP, TP IMAGING W/ HPV RNA, RFLX HPV TYPE 16,18/45: HPV DNA High Risk: NOT DETECTED

## 2018-06-14 DIAGNOSIS — F902 Attention-deficit hyperactivity disorder, combined type: Secondary | ICD-10-CM | POA: Diagnosis not present

## 2018-07-13 DIAGNOSIS — F329 Major depressive disorder, single episode, unspecified: Secondary | ICD-10-CM | POA: Diagnosis not present

## 2018-07-13 DIAGNOSIS — F900 Attention-deficit hyperactivity disorder, predominantly inattentive type: Secondary | ICD-10-CM | POA: Diagnosis not present

## 2018-09-14 ENCOUNTER — Telehealth: Payer: Self-pay | Admitting: *Deleted

## 2018-09-14 DIAGNOSIS — F411 Generalized anxiety disorder: Secondary | ICD-10-CM

## 2018-09-14 NOTE — Telephone Encounter (Signed)
Patient had annual exam on 06/02/18 and said never received Rx for Valium 5 mg. Please advise

## 2018-09-14 NOTE — Telephone Encounter (Signed)
Okay for Valium 5 mg p.o. daily as needed #30 with 1 refill.

## 2018-09-15 MED ORDER — DIAZEPAM 5 MG PO TABS: ORAL_TABLET | ORAL | 1 refills | Status: DC

## 2018-09-15 NOTE — Telephone Encounter (Signed)
Rx called in 

## 2018-10-07 DIAGNOSIS — F900 Attention-deficit hyperactivity disorder, predominantly inattentive type: Secondary | ICD-10-CM | POA: Diagnosis not present

## 2018-10-07 DIAGNOSIS — F329 Major depressive disorder, single episode, unspecified: Secondary | ICD-10-CM | POA: Diagnosis not present

## 2018-12-16 DIAGNOSIS — F329 Major depressive disorder, single episode, unspecified: Secondary | ICD-10-CM | POA: Diagnosis not present

## 2018-12-16 DIAGNOSIS — F900 Attention-deficit hyperactivity disorder, predominantly inattentive type: Secondary | ICD-10-CM | POA: Diagnosis not present

## 2019-02-02 ENCOUNTER — Encounter: Payer: Self-pay | Admitting: Gynecology

## 2019-03-15 DIAGNOSIS — F329 Major depressive disorder, single episode, unspecified: Secondary | ICD-10-CM | POA: Diagnosis not present

## 2019-03-15 DIAGNOSIS — F900 Attention-deficit hyperactivity disorder, predominantly inattentive type: Secondary | ICD-10-CM | POA: Diagnosis not present

## 2019-09-05 ENCOUNTER — Other Ambulatory Visit: Payer: Self-pay

## 2019-09-07 ENCOUNTER — Other Ambulatory Visit: Payer: Self-pay

## 2019-09-07 ENCOUNTER — Ambulatory Visit: Payer: No Typology Code available for payment source | Admitting: Women's Health

## 2019-09-07 ENCOUNTER — Encounter: Payer: Self-pay | Admitting: Women's Health

## 2019-09-07 VITALS — BP 130/80 | Ht 65.0 in | Wt 218.0 lb

## 2019-09-07 DIAGNOSIS — Z01419 Encounter for gynecological examination (general) (routine) without abnormal findings: Secondary | ICD-10-CM | POA: Diagnosis not present

## 2019-09-07 DIAGNOSIS — F3289 Other specified depressive episodes: Secondary | ICD-10-CM | POA: Diagnosis not present

## 2019-09-07 DIAGNOSIS — Z13220 Encounter for screening for lipoid disorders: Secondary | ICD-10-CM

## 2019-09-07 DIAGNOSIS — F411 Generalized anxiety disorder: Secondary | ICD-10-CM | POA: Diagnosis not present

## 2019-09-07 LAB — CBC WITH DIFFERENTIAL/PLATELET
Absolute Monocytes: 886 cells/uL (ref 200–950)
Basophils Absolute: 62 cells/uL (ref 0–200)
Basophils Relative: 0.6 %
Eosinophils Absolute: 587 cells/uL — ABNORMAL HIGH (ref 15–500)
Eosinophils Relative: 5.7 %
HCT: 46.8 % — ABNORMAL HIGH (ref 35.0–45.0)
Hemoglobin: 15.2 g/dL (ref 11.7–15.5)
Lymphs Abs: 3142 cells/uL (ref 850–3900)
MCH: 28.3 pg (ref 27.0–33.0)
MCHC: 32.5 g/dL (ref 32.0–36.0)
MCV: 87.2 fL (ref 80.0–100.0)
MPV: 12 fL (ref 7.5–12.5)
Monocytes Relative: 8.6 %
Neutro Abs: 5624 cells/uL (ref 1500–7800)
Neutrophils Relative %: 54.6 %
Platelets: 257 10*3/uL (ref 140–400)
RBC: 5.37 10*6/uL — ABNORMAL HIGH (ref 3.80–5.10)
RDW: 13.2 % (ref 11.0–15.0)
Total Lymphocyte: 30.5 %
WBC: 10.3 10*3/uL (ref 3.8–10.8)

## 2019-09-07 LAB — LIPID PANEL
Cholesterol: 199 mg/dL (ref <200)
HDL: 53 mg/dL (ref >=50)
LDL Cholesterol (Calc): 124 mg/dL (calc) — ABNORMAL HIGH
Non-HDL Cholesterol (Calc): 146 mg/dL (calc) — ABNORMAL HIGH (ref <130)
Total CHOL/HDL Ratio: 3.8 (calc) (ref <5.0)
Triglycerides: 114 mg/dL (ref <150)

## 2019-09-07 LAB — COMPREHENSIVE METABOLIC PANEL
AG Ratio: 1.5 (calc) (ref 1.0–2.5)
ALT: 15 U/L (ref 6–29)
AST: 18 U/L (ref 10–35)
Albumin: 4.3 g/dL (ref 3.6–5.1)
Alkaline phosphatase (APISO): 64 U/L (ref 37–153)
BUN: 16 mg/dL (ref 7–25)
CO2: 27 mmol/L (ref 20–32)
Calcium: 9.6 mg/dL (ref 8.6–10.4)
Chloride: 103 mmol/L (ref 98–110)
Creat: 0.94 mg/dL (ref 0.50–1.05)
Globulin: 2.9 g/dL (calc) (ref 1.9–3.7)
Glucose, Bld: 95 mg/dL (ref 65–99)
Potassium: 4.3 mmol/L (ref 3.5–5.3)
Sodium: 140 mmol/L (ref 135–146)
Total Bilirubin: 0.3 mg/dL (ref 0.2–1.2)
Total Protein: 7.2 g/dL (ref 6.1–8.1)

## 2019-09-07 MED ORDER — CITALOPRAM HYDROBROMIDE 40 MG PO TABS: ORAL_TABLET | ORAL | 4 refills | Status: AC

## 2019-09-07 MED ORDER — DIAZEPAM 5 MG PO TABS: ORAL_TABLET | ORAL | 2 refills | Status: DC

## 2019-09-07 NOTE — Patient Instructions (Signed)
It has been a pleasure knowing you Vitamin D 2000 IUs daily Screening colonoscopy [facility] [phone] Dr. Carlean Purl or Dr. Hilarie Fredrickson Mammogram [place] Maintenance for Postmenopausal Women Menopause is a normal process in which your ability to get pregnant comes to an end. This process happens slowly over many months or years, usually between the ages of 10 and 64. Menopause is complete when you have missed your menstrual periods for 12 months. It is important to talk with your health care provider about some of the most common conditions that affect women after menopause (postmenopausal women). These include heart disease, cancer, and bone loss (osteoporosis). Adopting a healthy lifestyle and getting preventive care can help to promote your health and wellness. The actions you take can also lower your chances of developing some of these common conditions. What should I know about menopause? During menopause, you may get a number of symptoms, such as:  Hot flashes. These can be moderate or severe.  Night sweats.  Decrease in sex drive.  Mood swings.  Headaches.  Tiredness.  Irritability.  Memory problems.  Insomnia. Choosing to treat or not to treat these symptoms is a decision that you make with your health care provider. Do I need hormone replacement therapy?  Hormone replacement therapy is effective in treating symptoms that are caused by menopause, such as hot flashes and night sweats.  Hormone replacement carries certain risks, especially as you become older. If you are thinking about using estrogen or estrogen with progestin, discuss the benefits and risks with your health care provider. What is my risk for heart disease and stroke? The risk of heart disease, heart attack, and stroke increases as you age. One of the causes may be a change in the body's hormones during menopause. This can affect how your body uses dietary fats, triglycerides, and cholesterol. Heart attack  and stroke are medical emergencies. There are many things that you can do to help prevent heart disease and stroke. Watch your blood pressure  High blood pressure causes heart disease and increases the risk of stroke. This is more likely to develop in people who have high blood pressure readings, are of African descent, or are overweight.  Have your blood pressure checked: ? Every 3-5 years if you are 18-40 years of age. ? Every year if you are 38 years old or older. Eat a healthy diet   Eat a diet that includes plenty of vegetables, fruits, low-fat dairy products, and lean protein.  Do not eat a lot of foods that are high in solid fats, added sugars, or sodium. Get regular exercise Get regular exercise. This is one of the most important things you can do for your health. Most adults should:  Try to exercise for at least 150 minutes each week. The exercise should increase your heart rate and make you sweat (moderate-intensity exercise).  Try to do strengthening exercises at least twice each week. Do these in addition to the moderate-intensity exercise.  Spend less time sitting. Even light physical activity can be beneficial. Other tips  Work with your health care provider to achieve or maintain a healthy weight.  Do not use any products that contain nicotine or tobacco, such as cigarettes, e-cigarettes, and chewing tobacco. If you need help quitting, ask your health care provider.  Know your numbers. Ask your health care provider to check your cholesterol and your blood sugar (glucose). Continue to have your blood tested as directed by your health care provider. Do I need  screening for cancer? Depending on your health history and family history, you may need to have cancer screening at different stages of your life. This may include screening for:  Breast cancer.  Cervical cancer.  Lung cancer.  Colorectal cancer. What is my risk for osteoporosis? After menopause, you may be  at increased risk for osteoporosis. Osteoporosis is a condition in which bone destruction happens more quickly than new bone creation. To help prevent osteoporosis or the bone fractures that can happen because of osteoporosis, you may take the following actions:  If you are 56-85 years old, get at least 1,000 mg of calcium and at least 600 mg of vitamin D per day.  If you are older than age 61 but younger than age 82, get at least 1,200 mg of calcium and at least 600 mg of vitamin D per day.  If you are older than age 75, get at least 1,200 mg of calcium and at least 800 mg of vitamin D per day. Smoking and drinking excessive alcohol increase the risk of osteoporosis. Eat foods that are rich in calcium and vitamin D, and do weight-bearing exercises several times each week as directed by your health care provider. How does menopause affect my mental health? Depression may occur at any age, but it is more common as you become older. Common symptoms of depression include:  Low or sad mood.  Changes in sleep patterns.  Changes in appetite or eating patterns.  Feeling an overall lack of motivation or enjoyment of activities that you previously enjoyed.  Frequent crying spells. Talk with your health care provider if you think that you are experiencing depression. General instructions See your health care provider for regular wellness exams and vaccines. This may include:  Scheduling regular health, dental, and eye exams.  Getting and maintaining your vaccines. These include: ? Influenza vaccine. Get this vaccine each year before the flu season begins. ? Pneumonia vaccine. ? Shingles vaccine. ? Tetanus, diphtheria, and pertussis (Tdap) booster vaccine. Your health care provider may also recommend other immunizations. Tell your health care provider if you have ever been abused or do not feel safe at home. Summary  Menopause is a normal process in which your ability to get pregnant comes to  an end.  This condition causes hot flashes, night sweats, decreased interest in sex, mood swings, headaches, or lack of sleep.  Treatment for this condition may include hormone replacement therapy.  Take actions to keep yourself healthy, including exercising regularly, eating a healthy diet, watching your weight, and checking your blood pressure and blood sugar levels.  Get screened for cancer and depression. Make sure that you are up to date with all your vaccines. This information is not intended to replace advice given to you by your health care provider. Make sure you discuss any questions you have with your health care provider. Document Revised: 04/21/2018 Document Reviewed: 04/21/2018 Elsevier Patient Education  2020 [employer].

## 2019-09-07 NOTE — Progress Notes (Signed)
   Sherrita Riederer [facility] 27-Jun-1965 [id number]   History:  54 y.o.  presents for annual exam.  Postmenopausal on no HRT with no bleeding. Anxiety and depression on Celexa and sees a counselor on a regular basis..  Normal Pap and mammogram history overdue for mammogram.  Not had a screening colonoscopy.  Not sexually active in years denies need for STD screen.  Gynecologic History Past medical history, past surgical history, family history and social history were all reviewed and documented in the EPIC chart.  [name], 15 doing well has had Gardasil.  Self-employed.  Father deceased at age 63.  Mother deceased from lung cancer and diabetes.  ROS:  A ROS was performed and pertinent positives and negatives are included.  Exam:  Vitals:   09/07/19 1041  BP: 130/80  Weight: 218 lb (98.9 kg)  Height: 5\' 5"  (1.651 m)   Body mass index is 36.28 kg/m.  General appearance:  Normal Thyroid:  Symmetrical, normal in size, without palpable masses or nodularity. Respiratory  Auscultation:  Clear without wheezing or rhonchi Cardiovascular  Auscultation:  Regular rate, without rubs, murmurs or gallops  Edema/varicosities:  Not grossly evident Abdominal  Soft,nontender, without masses, guarding or rebound.  Liver/spleen:  No organomegaly noted  Hernia:  None appreciated  Skin  Inspection:  Grossly normal   Breasts: Examined lying and sitting.   Right: Without masses, retractions, discharge or axillary adenopathy.   Left: Without masses, retractions, discharge or axillary adenopathy. Gentitourinary   Inguinal/mons:  Normal without inguinal adenopathy  External genitalia:  Normal  BUS/Urethra/Skene's glands:  Normal  Vagina:  Normal  Cervix:  Normal  Uterus:   normal in size, shape and contour.  Midline and mobile  Adnexa/parametria:     Rt: Without masses or tenderness.   Lt: Without masses or tenderness.  Anus and perineum: Normal  Digital rectal exam: Normal sphincter tone without palpated  masses or tenderness  Assessment/Plan:  54 y.o. SWF G1P1 for annual exam with no complaints of discharge, urinary symptoms or abdominal pain.  Postmenopausal no HRT with no bleeding/not sexually active Anxiety/depression stable on Celexa and rare Valium use ADD counseling and medication   Plan: Celexa 40 mg p.o. daily prescription, proper use given and reviewed, Valium 5 mg as needed uses sparingly aware of addictive properties.  Encouraged to continue with counseling, self-care and leisure activities.  SBEs, overdue for mammogram, breast center information given instructed to schedule.  Screening colonoscopy about her GI information given instructed to schedule.  Reviewed the benefits of a screening colonoscopy.  Increase regular exercise and decrease calorie/carbs congratulated on 10 pound weight loss.  Vitamin D 2000 IUs daily encouraged.  CBC, CMP, lipid panel, Pap normal 2020, new screening guidelines reviewed. [address] [facility], 11:32 AM 09/07/2019

## 2019-09-09 LAB — URINALYSIS, COMPLETE W/RFL CULTURE
Bacteria, UA: NONE SEEN /HPF
Bilirubin Urine: NEGATIVE
Glucose, UA: NEGATIVE
Hgb urine dipstick: NEGATIVE
Hyaline Cast: NONE SEEN /LPF
Ketones, ur: NEGATIVE
Leukocyte Esterase: NEGATIVE
Nitrites, Initial: NEGATIVE
Protein, ur: NEGATIVE
Specific Gravity, Urine: 1.028 (ref 1.001–1.03)
WBC, UA: NONE SEEN /HPF (ref 0–5)
pH: 5.5 (ref 5.0–8.0)

## 2019-09-09 LAB — URINE CULTURE
MICRO NUMBER:: 10417814
Result:: NO GROWTH
SPECIMEN QUALITY:: ADEQUATE

## 2019-09-09 LAB — CULTURE INDICATED

## 2020-05-16 ENCOUNTER — Other Ambulatory Visit: Payer: Self-pay | Admitting: *Deleted

## 2020-05-16 DIAGNOSIS — F411 Generalized anxiety disorder: Secondary | ICD-10-CM

## 2020-05-16 MED ORDER — DIAZEPAM 5 MG PO TABS: ORAL_TABLET | ORAL | 0 refills | Status: DC

## 2020-05-16 MED ORDER — DIAZEPAM 5 MG PO TABS: ORAL_TABLET | ORAL | 0 refills | Status: AC

## 2020-05-16 NOTE — Telephone Encounter (Signed)
Please let Zenora know I will send in #[phone] but she sees psychiatry and should be evaluated for her anxiety with them as well.

## 2020-05-16 NOTE — Telephone Encounter (Signed)
Patient informed with below note, reports she had a stroke last year and will find a new psychiatry, reports her old retired.    Tiffany can you send Rx again, it printed. I will make sure its on "normal" so it will go correctly.

## 2020-05-16 NOTE — Telephone Encounter (Signed)
Rx call in to pharmacy. Rx would not send electronically.

## 2020-05-16 NOTE — Addendum Note (Signed)
Addended by: Aura Camps on: 05/16/2020 01:41 PM   Modules accepted: Orders

## 2020-05-16 NOTE — Telephone Encounter (Signed)
Per note on 09/07/19 "Anxiety/depression stable on Celexa and rare Valium use"

## 2021-08-05 ENCOUNTER — Encounter: Payer: Self-pay | Admitting: Women's Health
